# Patient Record
Sex: Female | Born: 1960 | Race: White | Hispanic: No | Marital: Married | Smoking: Never smoker
Health system: Southern US, Community
[De-identification: ages and names within clinical notes are randomized; demographics above are authoritative.]

## PROBLEM LIST (undated history)

## (undated) DIAGNOSIS — B351 Tinea unguium: Secondary | ICD-10-CM

## (undated) DIAGNOSIS — I4510 Unspecified right bundle-branch block: Secondary | ICD-10-CM

## (undated) DIAGNOSIS — E039 Hypothyroidism, unspecified: Secondary | ICD-10-CM

## (undated) DIAGNOSIS — N3281 Overactive bladder: Secondary | ICD-10-CM

## (undated) DIAGNOSIS — IMO0002 Reserved for concepts with insufficient information to code with codable children: Secondary | ICD-10-CM

## (undated) DIAGNOSIS — K219 Gastro-esophageal reflux disease without esophagitis: Secondary | ICD-10-CM

## (undated) DIAGNOSIS — A0472 Enterocolitis due to Clostridium difficile, not specified as recurrent: Secondary | ICD-10-CM

## (undated) DIAGNOSIS — M1990 Unspecified osteoarthritis, unspecified site: Secondary | ICD-10-CM

## (undated) DIAGNOSIS — R011 Cardiac murmur, unspecified: Secondary | ICD-10-CM

## (undated) DIAGNOSIS — N12 Tubulo-interstitial nephritis, not specified as acute or chronic: Secondary | ICD-10-CM

## (undated) DIAGNOSIS — T7840XA Allergy, unspecified, initial encounter: Secondary | ICD-10-CM

## (undated) DIAGNOSIS — K2270 Barrett's esophagus without dysplasia: Secondary | ICD-10-CM

## (undated) DIAGNOSIS — A419 Sepsis, unspecified organism: Secondary | ICD-10-CM

## (undated) DIAGNOSIS — A048 Other specified bacterial intestinal infections: Secondary | ICD-10-CM

## (undated) DIAGNOSIS — Z5189 Encounter for other specified aftercare: Secondary | ICD-10-CM

## (undated) DIAGNOSIS — K5150 Left sided colitis without complications: Secondary | ICD-10-CM

## (undated) DIAGNOSIS — IMO0001 Reserved for inherently not codable concepts without codable children: Secondary | ICD-10-CM

## (undated) DIAGNOSIS — D649 Anemia, unspecified: Secondary | ICD-10-CM

## (undated) DIAGNOSIS — E669 Obesity, unspecified: Secondary | ICD-10-CM

## (undated) DIAGNOSIS — N201 Calculus of ureter: Secondary | ICD-10-CM

## (undated) DIAGNOSIS — M722 Plantar fascial fibromatosis: Secondary | ICD-10-CM

## (undated) DIAGNOSIS — T8092XA Unspecified transfusion reaction, initial encounter: Secondary | ICD-10-CM

## (undated) DIAGNOSIS — Z87442 Personal history of urinary calculi: Secondary | ICD-10-CM

## (undated) DIAGNOSIS — R7881 Bacteremia: Secondary | ICD-10-CM

## (undated) HISTORY — DX: Reserved for inherently not codable concepts without codable children: IMO0001

## (undated) HISTORY — DX: Reserved for concepts with insufficient information to code with codable children: IMO0002

## (undated) HISTORY — DX: Encounter for other specified aftercare: Z51.89

## (undated) HISTORY — DX: Enterocolitis due to Clostridium difficile, not specified as recurrent: A04.72

## (undated) HISTORY — DX: Barrett's esophagus without dysplasia: K22.70

## (undated) HISTORY — DX: Sepsis, unspecified organism: A41.9

## (undated) HISTORY — DX: Hypothyroidism, unspecified: E03.9

## (undated) HISTORY — DX: Obesity, unspecified: E66.9

## (undated) HISTORY — DX: Unspecified right bundle-branch block: I45.10

## (undated) HISTORY — DX: Other specified bacterial intestinal infections: A04.8

## (undated) HISTORY — DX: Gastro-esophageal reflux disease without esophagitis: K21.9

## (undated) HISTORY — DX: Unspecified transfusion reaction, initial encounter: T80.92XA

## (undated) HISTORY — PX: UPPER GASTROINTESTINAL ENDOSCOPY: SHX188

## (undated) HISTORY — DX: Unspecified osteoarthritis, unspecified site: M19.90

## (undated) HISTORY — DX: Plantar fascial fibromatosis: M72.2

## (undated) HISTORY — DX: Anemia, unspecified: D64.9

## (undated) HISTORY — DX: Left sided colitis without complications: K51.50

## (undated) HISTORY — DX: Tinea unguium: B35.1

## (undated) HISTORY — PX: COLONOSCOPY: SHX174

## (undated) HISTORY — PX: LAPAROSCOPIC GASTRIC BANDING WITH HIATAL HERNIA REPAIR: SHX6351

## (undated) HISTORY — DX: Bacteremia: R78.81

## (undated) HISTORY — DX: Calculus of ureter: N20.1

## (undated) HISTORY — DX: Overactive bladder: N32.81

## (undated) HISTORY — DX: Cardiac murmur, unspecified: R01.1

## (undated) HISTORY — DX: Allergy, unspecified, initial encounter: T78.40XA

---
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
## 1999-02-08 ENCOUNTER — Other Ambulatory Visit: Admission: RE | Admit: 1999-02-08 | Discharge: 1999-02-08 | Payer: Self-pay | Admitting: Obstetrics & Gynecology

## 2000-03-08 ENCOUNTER — Other Ambulatory Visit: Admission: RE | Admit: 2000-03-08 | Discharge: 2000-03-08 | Payer: Self-pay | Admitting: Obstetrics & Gynecology

## 2000-11-14 HISTORY — PX: CHOLECYSTECTOMY: SHX55

## 2001-05-30 ENCOUNTER — Other Ambulatory Visit: Admission: RE | Admit: 2001-05-30 | Discharge: 2001-05-30 | Payer: Self-pay | Admitting: Obstetrics & Gynecology

## 2001-11-07 ENCOUNTER — Emergency Department (HOSPITAL_COMMUNITY): Admission: EM | Admit: 2001-11-07 | Discharge: 2001-11-08 | Payer: Self-pay | Admitting: Emergency Medicine

## 2001-11-08 ENCOUNTER — Ambulatory Visit (HOSPITAL_COMMUNITY): Admission: RE | Admit: 2001-11-08 | Discharge: 2001-11-08 | Payer: Self-pay | Admitting: Emergency Medicine

## 2001-11-08 ENCOUNTER — Encounter: Payer: Self-pay | Admitting: Emergency Medicine

## 2001-11-22 ENCOUNTER — Encounter: Payer: Self-pay | Admitting: General Surgery

## 2001-11-26 ENCOUNTER — Observation Stay (HOSPITAL_COMMUNITY): Admission: RE | Admit: 2001-11-26 | Discharge: 2001-11-27 | Payer: Self-pay | Admitting: General Surgery

## 2001-11-26 ENCOUNTER — Encounter (INDEPENDENT_AMBULATORY_CARE_PROVIDER_SITE_OTHER): Payer: Self-pay | Admitting: Specialist

## 2002-01-15 ENCOUNTER — Encounter: Payer: Self-pay | Admitting: Gastroenterology

## 2002-01-15 ENCOUNTER — Ambulatory Visit (HOSPITAL_COMMUNITY): Admission: RE | Admit: 2002-01-15 | Discharge: 2002-01-15 | Payer: Self-pay | Admitting: Gastroenterology

## 2002-08-27 ENCOUNTER — Other Ambulatory Visit: Admission: RE | Admit: 2002-08-27 | Discharge: 2002-08-27 | Payer: Self-pay | Admitting: Obstetrics & Gynecology

## 2003-03-27 ENCOUNTER — Encounter: Payer: Self-pay | Admitting: Emergency Medicine

## 2003-03-27 ENCOUNTER — Emergency Department (HOSPITAL_COMMUNITY): Admission: EM | Admit: 2003-03-27 | Discharge: 2003-03-27 | Payer: Self-pay | Admitting: Emergency Medicine

## 2003-09-05 ENCOUNTER — Other Ambulatory Visit: Admission: RE | Admit: 2003-09-05 | Discharge: 2003-09-05 | Payer: Self-pay | Admitting: Obstetrics & Gynecology

## 2004-01-15 ENCOUNTER — Ambulatory Visit (HOSPITAL_COMMUNITY): Admission: RE | Admit: 2004-01-15 | Discharge: 2004-01-15 | Payer: Self-pay | Admitting: Gastroenterology

## 2005-05-04 ENCOUNTER — Ambulatory Visit: Payer: Self-pay | Admitting: Gastroenterology

## 2006-05-10 ENCOUNTER — Ambulatory Visit: Payer: Self-pay | Admitting: Gastroenterology

## 2006-11-16 ENCOUNTER — Ambulatory Visit: Payer: Self-pay | Admitting: Gastroenterology

## 2006-11-24 ENCOUNTER — Encounter (INDEPENDENT_AMBULATORY_CARE_PROVIDER_SITE_OTHER): Payer: Self-pay | Admitting: Specialist

## 2006-11-24 ENCOUNTER — Encounter (INDEPENDENT_AMBULATORY_CARE_PROVIDER_SITE_OTHER): Payer: Self-pay | Admitting: *Deleted

## 2006-11-24 ENCOUNTER — Ambulatory Visit: Payer: Self-pay | Admitting: Gastroenterology

## 2006-12-06 ENCOUNTER — Ambulatory Visit: Payer: Self-pay | Admitting: Gastroenterology

## 2006-12-06 LAB — CONVERTED CEMR LAB
Basophils Relative: 1.1 % — ABNORMAL HIGH (ref 0.0–1.0)
Folate: 10.5 ng/mL
Monocytes Relative: 5.4 % (ref 3.0–11.0)
Platelets: 457 10*3/uL — ABNORMAL HIGH (ref 150–400)
RDW: 17.2 % — ABNORMAL HIGH (ref 11.5–14.6)
Saturation Ratios: 6.3 % — ABNORMAL LOW (ref 20.0–50.0)
Transferrin: 339.3 mg/dL (ref >=212.0)
Vitamin B-12: 273 pg/mL (ref 211–911)

## 2006-12-12 ENCOUNTER — Encounter (INDEPENDENT_AMBULATORY_CARE_PROVIDER_SITE_OTHER): Payer: Self-pay | Admitting: *Deleted

## 2006-12-12 ENCOUNTER — Ambulatory Visit (HOSPITAL_COMMUNITY): Admission: RE | Admit: 2006-12-12 | Discharge: 2006-12-12 | Payer: Self-pay | Admitting: Gastroenterology

## 2007-01-01 ENCOUNTER — Ambulatory Visit: Payer: Self-pay | Admitting: Gastroenterology

## 2007-01-01 LAB — CONVERTED CEMR LAB: OCCULT 4: NEGATIVE

## 2007-05-25 ENCOUNTER — Encounter: Admission: RE | Admit: 2007-05-25 | Discharge: 2007-05-25 | Payer: Self-pay | Admitting: Surgery

## 2007-07-24 ENCOUNTER — Encounter: Admission: RE | Admit: 2007-07-24 | Discharge: 2007-10-22 | Payer: Self-pay | Admitting: Surgery

## 2007-08-07 ENCOUNTER — Encounter: Payer: Self-pay | Admitting: Internal Medicine

## 2007-08-07 HISTORY — PX: LAPAROSCOPIC GASTRIC BANDING: SHX1100

## 2007-08-08 ENCOUNTER — Inpatient Hospital Stay (HOSPITAL_COMMUNITY): Admission: RE | Admit: 2007-08-08 | Discharge: 2007-08-10 | Payer: Self-pay | Admitting: Surgery

## 2008-11-11 ENCOUNTER — Encounter: Payer: Self-pay | Admitting: Internal Medicine

## 2008-11-11 ENCOUNTER — Encounter: Admission: RE | Admit: 2008-11-11 | Discharge: 2008-11-11 | Payer: Self-pay | Admitting: Surgery

## 2009-10-23 ENCOUNTER — Encounter (INDEPENDENT_AMBULATORY_CARE_PROVIDER_SITE_OTHER): Payer: Self-pay | Admitting: *Deleted

## 2009-11-25 ENCOUNTER — Ambulatory Visit: Payer: Self-pay | Admitting: Internal Medicine

## 2009-11-25 ENCOUNTER — Encounter (INDEPENDENT_AMBULATORY_CARE_PROVIDER_SITE_OTHER): Payer: Self-pay | Admitting: *Deleted

## 2009-11-25 DIAGNOSIS — D509 Iron deficiency anemia, unspecified: Secondary | ICD-10-CM

## 2009-11-25 DIAGNOSIS — K2270 Barrett's esophagus without dysplasia: Secondary | ICD-10-CM

## 2009-11-25 DIAGNOSIS — K219 Gastro-esophageal reflux disease without esophagitis: Secondary | ICD-10-CM

## 2009-11-27 LAB — CONVERTED CEMR LAB
Basophils Relative: 0.7 % (ref 0.0–3.0)
Eosinophils Relative: 3.6 % (ref 0.0–5.0)
HCT: 37.7 % (ref 36.0–46.0)
MCV: 89.7 fL (ref 78.0–100.0)
Monocytes Absolute: 0.4 10*3/uL (ref 0.1–1.0)
Monocytes Relative: 7.7 % (ref 3.0–12.0)
Neutrophils Relative %: 56.7 % (ref 43.0–77.0)
RBC: 4.21 M/uL (ref 3.87–5.11)
WBC: 5.7 10*3/uL (ref 4.5–10.5)

## 2009-12-07 ENCOUNTER — Ambulatory Visit: Payer: Self-pay | Admitting: Internal Medicine

## 2009-12-15 ENCOUNTER — Encounter: Payer: Self-pay | Admitting: Internal Medicine

## 2010-02-19 ENCOUNTER — Encounter: Payer: Self-pay | Admitting: Internal Medicine

## 2010-02-25 ENCOUNTER — Emergency Department (HOSPITAL_COMMUNITY): Admission: EM | Admit: 2010-02-25 | Discharge: 2010-02-25 | Payer: Self-pay | Admitting: Emergency Medicine

## 2010-02-26 ENCOUNTER — Encounter: Admission: RE | Admit: 2010-02-26 | Discharge: 2010-02-26 | Payer: Self-pay | Admitting: Surgery

## 2010-12-16 NOTE — Procedures (Signed)
Summary: Esophageal Manometry/[facility]  Esophageal Manometry/[facility]   Imported By: Phillis Knack 11/27/2009 13:08:49  _____________________________________________________________________  External Attachment:    Type:   Image     Comment:   External Document

## 2010-12-16 NOTE — Miscellaneous (Signed)
Summary: omeprazole prescription  Clinical Lists Changes  Medications: Added new medication of OMEPRAZOLE 40 MG  CPDR (OMEPRAZOLE) 1 each day 30 minutes before meal - Signed Rx of OMEPRAZOLE 40 MG  CPDR (OMEPRAZOLE) 1 each day 30 minutes before meal;  #30 x 3;  Signed;  Entered by: Joylene John RN;  Authorized by: Gatha Mayer MD, [facility];  Method used: Electronically to [id number] [address], [place], [place]  [address], Ph: [phone] or [phone], Fax: [phone]    Prescriptions: OMEPRAZOLE 40 MG  CPDR (OMEPRAZOLE) 1 each day 30 minutes before meal  #30 x 3   Entered by:   Joylene John RN   Authorized by:   Gatha Mayer MD, [facility]   Signed by:   Joylene John RN on 12/07/2009   Method used:   Electronically to        [id number] (retail)       [address]       [place], [place]  [address]       Ph: [phone] or [phone]       Fax: [phone]   RxID:   [id number]

## 2010-12-16 NOTE — Letter (Signed)
Summary: Patient Notice-[facility]  Section, [place] [address]   Phone: [phone]  Fax: [phone]        December 15, 2009 MRN: [id number]    [place], [place]  [address]    Dear Ms. Jannifer Franklin,  I am pleased to inform you that the biopsies taken during your recent endoscopic examination did not show any evidence of cancer upon pathologic examination.  However, your biopsies indicate you still have a condition known as Barrett's esophagus. While not cancer, it is pre-cancerous (can progress to cancer) and needs to be monitored with repeat endoscopic examination and biopsies.  Fortunately, it is quite rare that this develops into cancer, but careful monitoring of the condition along with taking your medication as prescribed is important in reducing the risk of developing cancer.  It is my recommendation that you have a repeat upper gastrointestinal endoscopic examination in 3 years.  Continue with treatment plan as outlined the day of your exam.  By the time you receive this letter you should have heard from [place] about scheduling an appointment to see Dr. Hassell Done in follow-up. I have not planned a follow-up visit with me yet but anticipate seeing you in the office at some point in the next several months. I will decide that after you see Dr. Hassell Done.  Please call us if you have or develop heartburn, reflux symptoms, any swallowing problems, or if you have questions about your condition that have not been fully answered at this time.  Sincerely,  Gatha Mayer MD, [facility]  This letter has been electronically signed by your physician.  Appended Document: Patient Notice-Barrett's [facility]opghagus letter mailed 2.4.11

## 2010-12-16 NOTE — Op Note (Signed)
Summary: lap band and hiatal hernia repair  NAME:  Joyce Kim, Joyce Kim                ACCOUNT NO.:  [id number]      MEDICAL RECORD NO.:  [id number]          PATIENT TYPE:  AMB      LOCATION:  DAY                          FACILITY:  [facility]      PHYSICIAN:  Isabel Caprice. Hassell Done, MD  DATE OF BIRTH:  12/17/1960      DATE OF PROCEDURE:  08/07/2007   DATE OF DISCHARGE:                                  OPERATIVE REPORT      PREOPERATIVE DIAGNOSES:   1. Morbid obesity, body mass index 46.   2. Large type 3 mixed hiatal hernia.      PROCEDURES:   1. Laparoscopic repair of hiatal hernia.   2. Allergan lap band placement.      SURGEON:  Isabel Caprice. Hassell Done, MD.      ASSISTANTMarland Kitchen T. Hoxworth, M.D.      ANESTHESIA:  General endotracheal.      DESCRIPTION OF PROCEDURE:  The patient is a 4-year lady who was taken   to room #1 and given general anesthesia.  Preoperatively, she refused to   consider gastric bypass but elected to go with the gastric banding   because her morbid obesity.  In addition she has a very large hiatal   hernia.      Standard trocars were placed after initially getting in through the left   upper quadrant with a 10 Optiview 0 degree scope.  The liver was   retracted with the [facility] type retractor.  The Babcocks were used on   the stomach and it was brought down and the Harmonic scalpel was used to   tease the stomach out of the chest and to cut the sac.  I did this from   the left to the right side freeing this in its entirety.  I eventually   put a Penrose around the back of the esophagogastric junction getting   the esophagus to length.  There was no question with the landmarks that   we got good esophagus to length in the abdomen and I made a decision not   to endoscope the patient.  The crura were well defined and I went ahead   and repaired those with four pledgeted sutures posteriorly and got it   nice and snug.  I then took a piece of MTF mesh using a MTF 4 cm  x 7 cm   cutting it and then putting it in onto the diaphragm and suturing in   place with four sutures of 2-0 Surgitek using a free needle and then   tacking it with the tie knot.  Beneath this was squirted Tisseel to help   seal it on the diaphragm.         Repair looked good and I then went down to the base of the right crus   and found an uninterrupted spot, passed a band passer without difficulty   around the stomach and then plicated an Allergan APL large band with   three sutures, again placed individually  and tied with tie knots.  It   was then brought out through the 15 trocar in the right lower side and   then standard pocket was made with four sutures of 2-0 Prolene and the   excess fluid was relieved from the   band and then it was connected to the port which was then sutured to the   fascia with four sutures.  Wounds were all irrigated and closed 4-0   Vicryl and with Dermabond.  Also some of them had some staples place as   well.  The patient seemed to tolerate the procedure well and was taken   to the recovery room in satisfactory condition.               Isabel Caprice Hassell Done, MD   Electronically Signed            MBM/MEDQ  D:  08/07/2007  T:  08/08/2007  Job:  [id number]

## 2010-12-16 NOTE — Letter (Signed)
Summary: EGD Instructions  [facility]  [place], [place] [address]   Phone: [phone]  Fax: [phone]       ELYCIA WOODSIDE    1961/09/29    MRN: [id number]       Procedure Day /Date: Monday, 12/07/09     Arrival Time:  1:00pm     Procedure Time: 2:00 pm     Location of Procedure:                    _[facility] (4th Floor)  PREPARATION FOR ENDOSCOPY   On Monday 12/07/09,  THE DAY OF THE PROCEDURE:  1.   No solid foods, milk or milk products are allowed after midnight the night before your procedure.  2.   Do not drink anything colored red or purple.  Avoid juices with pulp.  No orange juice.  3.  You may drink clear liquids until 12:00pm, which is 2 hours before your procedure.                                                                                                CLEAR LIQUIDS INCLUDE: Water Jello Ice Popsicles Tea (sugar ok, no milk/cream) Powdered fruit flavored drinks Coffee (sugar ok, no milk/cream) Gatorade Juice: apple, white grape, white cranberry  Lemonade Clear bullion, consomm, broth Carbonated beverages (any kind) Strained chicken noodle soup Hard Candy   MEDICATION INSTRUCTIONS  Unless otherwise instructed, you should take regular prescription medications with a small sip of water as early as possible the morning of your procedure.  Additional medication instructions: None                OTHER INSTRUCTIONS  You will need a responsible adult at least 49 years of age to accompany you and drive you home.   This person must remain in the waiting room during your procedure.  Wear loose fitting clothing that is easily removed.  Leave jewelry and other valuables at home.  However, you may wish to bring a book to read or an iPod/MP3 player to listen to music as you wait for your procedure to start.  Remove all body piercing jewelry and leave at home.  Total time from sign-in until discharge  is approximately 2-3 hours.  You should go home directly after your procedure and rest.  You can resume normal activities the day after your procedure.  The day of your procedure you should not:   Drive   Make legal decisions   Operate machinery   Drink alcohol   Return to work  You will receive specific instructions about eating, activities and medications before you leave.    The above instructions have been reviewed and explained to me by   _______________________    I fully understand and can verbalize these instructions _____________________________ Date _________

## 2010-12-16 NOTE — Assessment & Plan Note (Signed)
Summary: dysphagia from time to time. Hx Barretts   History of Present Illness Visit Type: Initial Visit Primary GI MD: Silvano Rusk MD [facility] Primary Provider: Dory Horn, MD Chief Complaint: Patient c/o several months of feeling a "lump in her throat." She also c/o gerd, nausea and vomiting which she attributes to her lapband surgery. She denies any abdominal pain. History of Present Illness:   50 year old white woman with problems as described above. This has been increasing over time, since the lap band surgery (2008), but especially in the last year.. That surgery has been successful with 100+ pound weight loss. There is some difficulty swallowing, and there is rapid regurgitation but there may be some impacted dysphagia as well. She has not gotten on proton pump inhibitors or antacids I am aware of. I think she has had her lack and tightened 3 times since the initial surgery. She has not discussed this with her surgeon.   GI Review of Systems    Reports acid reflux, belching, heartburn, nausea, and  vomiting.      Denies abdominal pain, bloating, chest pain, dysphagia with liquids, dysphagia with solids, loss of appetite, vomiting blood, weight loss, and  weight gain.        Denies anal fissure, black tarry stools, change in bowel habit, constipation, diarrhea, diverticulosis, fecal incontinence, heme positive stool, hemorrhoids, irritable bowel syndrome, jaundice, light color stool, liver problems, rectal bleeding, and  rectal pain.    Current Medications (verified): 1)  Multivitamins  Tabs (Multiple Vitamin) .... Take 1 Tablet By Mouth Once A Day 2)  Vitamin C 500 Mg Tabs (Ascorbic Acid) .... Take 1 Tablet By Mouth Once A Day 3)  Vitamin B12 (Unknown Dosage) .... Take 1 Tablet By Mouth Once A Day 4)  Vitamin B6 (Unknown Dosage) .... Take 1 Tablet By Mouth Once A Day  Allergies (verified): 1)  ! Sulfa 2)  ! * Ivp Dye 3)  ! Doxycycline  Past History:  Past Medical  History: Barrett's Esophagus dx 2008 Anemia GERD, large hiatal hernia s/p repair Obesity  Past Surgical History: Cholecystectomy Bariatric Surgery (lapband) 2008 Martin Hiatal Herniorapphy Repair of Ureters   Family History: Family History of Breast Cancer: Great Aunts, Aunts No FH of Colon Cancer: Family History of Pancreatic Cancer: Paternal Grandfather, Maternal Grandfather Family History of Colon Polyps: Mother Family History of Diabetes: Aunts, Cousins Family History of Heart Disease: Aunts  Social History: Occupation: Student Patient has never smoked.  Alcohol Use - yes-rare Daily Caffeine Use-1 cup daily Illicit Drug Use - no Patient does not get regular exercise.  Smoking Status:  never Drug Use:  no Does Patient Exercise:  no  Review of Systems       The patient complains of allergy/sinus.         All other ROS negative except as per HPI.   Vital Signs:  Patient profile:   50 year old female Height:      67 inches Weight:      221.13 pounds BMI:     34.76 BSA:     2.11 Pulse rate:   80 / minute Pulse rhythm:   regular BP sitting:   112 / 68  (left arm)  Vitals Entered By: Awilda Bill CMA Deborra Medina) (November 25, 2009 10:33 AM)  Physical Exam  General:  obese.   Eyes:  PERRLA, no icterus. Mouth:  No deformity or lesions, dentition normal. Neck:  Supple; no masses or thyromegaly. Lungs:  Clear throughout to auscultation. Heart:  Regular rate and rhythm; no murmurs, rubs,  or bruits. Abdomen:  obese, soft and non-tender s/p lap band surgery, scars no HSM BS+ Neurologic:  Alert and  oriented x4;  Cervical Nodes:  No significant cervical adenopathy. Psych:  Alert and cooperative. Normal mood and affect.   Impression & Recommendations:  Problem # 1:  DYSPHAGIA (ICD-787.29) Assessment New ? due to lap band - 11/11/08 UGI shows tight lap band with little passage of liquid this was found after visit await EGD Orders: EGD (EGD)  Problem # 2:   BARRETT'S ESOPHAGUS (ICD-530.85) Assessment: Unchanged needs to be reassesed - she is overdue for first 1 yr follow-up with large hiatal hernia sometimes can get cardia and not distal esophageal biopsies so important to see what is present after hiatal hernia repair Orders: EGD (EGD)  Problem # 3:  GERD (ICD-530.81) Assessment: Deteriorated await EGD ? how much due to lap band  Problem # 4:  ANEMIA-IRON DEFICIENCY (ICD-280.9) Assessment: Unchanged has not had a CBC since 2008 or 2009 though may have had Hct at GYN Iron deficiency previously thought due to hiatal hernia Orders: TLB-CBC Platelet - w/Differential (85025-CBCD) TLB-Ferritin (82728-FER)  Patient Instructions: 1)  Please go to the basement to have your lab tests drawn today.  2)  We will see you at your procedure on 12/07/09. 3)  [facility] Patient Information Guide given to patient.  4)  Upper Endoscopy brochure given.  5)  Copy sent to : Kaylyn Lim, MD 6)  The medication list was reviewed and reconciled.  All changed / newly prescribed medications were explained.  A complete medication list was provided to the patient / caregiver.  Appended Document: dysphagia from time to time. Hx Barretts HPI addedum: her bed is elevated 2" and uses pillows to elevate also problems are not clearly related to eating late and lying down as has happened either way recently has had painful swallowing, [facility] will reflux and be awakened at night

## 2010-12-16 NOTE — Procedures (Signed)
Summary: Upper Endoscopy  Patient: [name] Note: All result statuses are Final unless otherwise noted.  Tests: (1) Upper Endoscopy (EGD)   EGD Upper Endoscopy       [facility] [employer] [facility] [employer]     [place], [place]  [address]           ENDOSCOPY PROCEDURE REPORT           PATIENT:  Joyce Kim, Joyce Kim  MR#:  [id number]     BIRTHDATE:  26-Oct-1961, 48 yrs. old  GENDER:  female           ENDOSCOPIST:  Gatha Mayer, MD, [facility]           PROCEDURE DATE:  12/07/2009     PROCEDURE:  EGD with biopsy     ASA CLASS:  Class I     INDICATIONS:  follow-up of Barrett's Esophagus, dysphagia more of     a globus with regurgitation problems though some ? of impact     dysphagia. S/p fundoplication and laparoscopic band procedure for     obesity           MEDICATIONS:   Fentanyl 50 mcg IV, Versed 9 mg IV     TOPICAL ANESTHETIC:  Exactacain Spray           DESCRIPTION OF PROCEDURE:   After the risks benefits and     alternatives of the procedure were thoroughly explained, informed     consent was obtained.  The [facility] GIF-H180 [id number] endoscope was     introduced through the mouth and advanced to the second portion of     the duodenum, without limitations.  The instrument was slowly     withdrawn as the mucosa was fully examined.     <<PROCEDUREIMAGES>>           There were columnar mucosal changes consistent with Barrett's     esophagus identified. in the distal esophagus. Irregular columns     of salmon-colored mucosa from 27-30 cm, ? Barrett's vs.     esophagitis or both. There is one larger area 1-2 cm seen at     z-line area (30 cm) that rolls back and forth with motility that     could be Barrett's also. Multiple biopsies were obtained and sent     to pathology.  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 35 cm from the     incisors, ? lap band constriction vs. recurrent hiatal hernia.     Otherwise the examination was normal.    Retroflexed views revealed     Retroflexion  exam demonstrated findings as previously described.     The scope was then withdrawn from the patient and the procedure     completed.           COMPLICATIONS:  None           ENDOSCOPIC IMPRESSION:     1) Columnar mucosa, ? 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     4) Otherwise normal examination           RECOMMENDATIONS:     Start omeprazole 40 mg daily every AM 30 mins before breakfast     #30 3 refills           REPEAT  EXAM:  In for EGD, pending biopsy results.           Gatha Mayer, MD, [facility]           CC:  Johnathan Hausen, MD     The Patient           n.     Lorrin Mais:   Gatha Mayer at 12/07/2009 02:49 PM           Juanito Doom, [id number]  Note: An exclamation mark (!) indicates a result that was not dispersed into the flowsheet. Document Creation Date: 12/07/2009 2:49 PM _______________________________________________________________________  (1) Order result status: Final Collection or observation date-time: 12/07/2009 14:28 Requested date-time:  Receipt date-time:  Reported date-time:  Referring Physician:   Ordering Physician: Silvano Rusk [phone]) Specimen Source:  Source: Tawanna Cooler Order Number: [id number] Lab site:   Appended Document: Upper Endoscopy will make her an appointment to see Dr. Hassell Done  Appended Document: Upper Endoscopy Dr Hassell Done 12-25-09 2:40  Appended Document: Upper Endoscopy     Procedures Next Due Date:    EGD: 12/2012

## 2010-12-16 NOTE — Procedures (Signed)
Summary: Colonosocpy: Dr. Velora Heckler   Colonoscopy  Procedure date:  11/24/2006  Findings:      Results: Normal. Location:  [facility].    Procedures Next Due Date:    Colonoscopy: 11/2011  Patient Name: Joyce Kim, Joyce Kim MRN: [id number] Procedure Procedures: Colonoscopy CPT: 27253.    with multiple biopsies. CPT: [phone].  Personnel: Endoscopist: Clarene Reamer, MD.  Exam Location: Exam performed in Outpatient Clinic. Outpatient  Patient Consent: Procedure, Alternatives, Risks and Benefits discussed, consent obtained, from patient. Consent was obtained by the RN.  Indications  Evaluation of: Anemia with low iron saturation.  Symptoms: Diarrhea Patient is having increased frequency of stools.  History  Current Medications: Patient is not currently taking Coumadin.  Pre-Exam Physical: Performed Dec 02, 2002. Entire physical exam was normal. Cardio- pulmonary exam, Rectal exam, HEENT exam , Abdominal exam, Mental status exam WNL.  Comments: Pt. history reviewed/updated, physical exam performed prior to initiation of sedation? Exam Exam: Extent of exam reached: Ileum, extent intended: Ileum.  The cecum was identified by appendiceal orifice and IC valve. Colon retroflexion performed. Images taken. ASA Classification: II. Tolerance: good.  Monitoring: Pulse and BP monitoring, Oximetry used. Supplemental O2 given.  Colon Prep Prep results: good.  Sedation Meds: Patient assessed and found to be appropriate for moderate (conscious) sedation.  Findings - NOT SEEN ON EXAM: Ileum to Rectum. Polyps, AVM's, Colitis, Tumors, Melanosis, Crohn's, Diverticulosis, Hemorrhoids, Comments: not sure why anemic and no endoscopic evidence for diarrhea   multiple bx,s to r/o microscopic colitis or colagenous.   Assessment Normal examination.  Events  Unplanned Interventions: No intervention was required.  Unplanned Events: There were no  complications. Plans Medication Plan: Await pathology. Continue current medications.  Patient Education: Patient given standard instructions for: a normal exam. Patient instructed to get routine colonoscopy every 5 years.  Disposition: After procedure patient sent to recovery. After recovery patient sent home.  Scheduling/Referral: Call office for appointment, to Clarene Reamer, MD, around Dec 06, 2006.   This report was created from the original endoscopy report, which was reviewed and signed by the above listed endoscopist.

## 2010-12-16 NOTE — Letter (Signed)
Summary: [facility]   Imported By: Phillis Knack 03/09/2010 13:11:50  _____________________________________________________________________  External Attachment:    Type:   Image     Comment:   External Document

## 2010-12-16 NOTE — Letter (Signed)
Summary: *Referral Letter  [facility]  [place], [place] [address]   Phone: [phone]  Fax: [phone]    12/15/2009  Johnathan Hausen, MD, [place], [place] [address]  Dear Catalina Antigua:   Thank you in advance for agreeing to see my patient:  Joyce Kim [address] [place], [place]  [address]  Phone: [phone]  Reason for Referral: GERD and Barrett's with symptoms of gastric dysfunction after lap band and hiatal hernia repair. ? if lap band needs adjustment    Current Medications: 1)  MULTIVITAMINS  TABS (MULTIPLE VITAMIN) Take 1 tablet by mouth once a day 2)  VITAMIN C 500 MG TABS (ASCORBIC ACID) Take 1 tablet by mouth once a day 3)  * VITAMIN B12 (UNKNOWN DOSAGE) Take 1 tablet by mouth once a day 4)  * VITAMIN B6 (UNKNOWN DOSAGE) Take 1 tablet by mouth once a day 5)  IRON 325 (65 FE) MG TABS (FERROUS SULFATE) 1 by mouth qd 6)  OMEPRAZOLE 40 MG  CPDR (OMEPRAZOLE) 1 each day 30 minutes before meal   Past Medical History: 1)  Barrett's Esophagus dx 2008 2)  Anemia 3)  GERD, large hiatal hernia s/p repair 4)  Obesity    Pertinent Labs: CBC is normal. ferritin low normal   Thank you again for agreeing to see our patient; please contact us if you have any further questions or need additional information.  Sincerely,  Gatha Mayer MD, [facility]  Appended Document: *Referral Letter Patient notified of appointment with Dr Hassell Done for 12-25-09 2:40. Patient also notified of path results   Clinical Lists Changes  Orders: Added new Test order of [facility] ([facility]) - Signed

## 2010-12-16 NOTE — Procedures (Signed)
Summary: EGD: Dr. Velora Heckler: [facility]   EGD  Procedure date:  11/24/2006  Findings:      Location: [place]   Patient Name: Joyce, Kim MRN: [id number] Procedure Procedures: Panendoscopy (EGD) CPT: 02111.    with biopsy(s)/brushing(s). CPT: U5434024.  Personnel: Endoscopist: Clarene Reamer, MD.  Exam Location: Exam performed in Outpatient Clinic. Outpatient  Patient Consent: Procedure, Alternatives, Risks and Benefits discussed, consent obtained, from patient. Consent was obtained by the RN.  Indications  Evaluation of: Anemia,  with low iron saturation.  Symptoms: Dyspepsia, Reflux symptoms  History  Current Medications: Patient is not currently taking Coumadin.  Pre-Exam Physical: Performed Dec 02, 2002  Entire physical exam was normal. Cardio- pulmonary exam, HEENT exam, Abdominal exam, Mental status exam WNL.  Comments: Pt. history reviewed/updated, physical exam performed prior to initiation of sedation? Exam Exam Info: Maximum depth of insertion Duodenum, intended Duodenum. Patient position: on left side. Vocal cords visualized. Gastric retroflexion performed. Images taken. ASA Classification: II. Tolerance: good.  Sedation Meds: Patient assessed and found to be appropriate for moderate (conscious) sedation. Fentanyl given IV. Versed given IV. Cetacaine Spray given aerosolized.  Monitoring: BP and pulse monitoring done. Oximetry used. Supplemental O2 given  Findings - HIATAL HERNIA: 10 cms. in length. r/o barretts. Biopsy/Hiatal Hernia taken.  ICD9: Hernia, Hiatal: 553.3. - MUCOSAL ABNORMALITY: Duodenal Bulb to Jejunum. Erythematous mucosa. Granular mucosa. Edema present. Comment: mild chronic duodenitis.  - MUCOSAL ABNORMALITY: Body to Antrum. Erythematous mucosa. Granular mucosa. RUT done, results pending. Comment: mild gastritis.   Assessment Abnormal examination, see findings above.  Diagnoses: 530.11: Reflux.  553.3: Hernia,  Hiatal.   Events  Unplanned Intervention: No unplanned interventions were required.  Unplanned Events: There were no complications. Plans Medication(s): Await pathology. Continue current medications. PPI: Lansoprazole/Prevacid 30 mg   Patient Education: Patient given standard instructions for: Hiatal Hernia. Reflux. Mucosal Abnormality.  Comments: Joyce Kim really needs to consider a fundoplication   Disposition: After procedure patient sent to recovery. After recovery patient sent home.   This report was created from the original endoscopy report, which was reviewed and signed by the above listed endoscopist.

## 2011-03-29 NOTE — Op Note (Signed)
Joyce Kim, Joyce Kim                ACCOUNT NO.:  [id number]   MEDICAL RECORD NO.:  [id number]          PATIENT TYPE:  AMB   LOCATION:  DAY                          FACILITY:  [facility]   PHYSICIAN:  Isabel Caprice. Hassell Done, MD  DATE OF BIRTH:  09-12-1961   DATE OF PROCEDURE:  08/07/2007  DATE OF DISCHARGE:                               OPERATIVE REPORT   PREOPERATIVE DIAGNOSES:  1. Morbid obesity, body mass index 46.  2. Large type 3 mixed hiatal hernia.   PROCEDURES:  1. Laparoscopic repair of hiatal hernia.  2. Allergan lap band placement.   SURGEON:  Isabel Caprice. Hassell Done, MD.   ASSISTANTMarland Kitchen T. Hoxworth, M.D.   ANESTHESIA:  General endotracheal.   DESCRIPTION OF PROCEDURE:  The patient is a 82-year lady who was taken  to room #1 and given general anesthesia.  Preoperatively, she refused to  consider gastric bypass but elected to go with the gastric banding  because her morbid obesity.  In addition she has a very large hiatal  hernia.   Standard trocars were placed after initially getting in through the left  upper quadrant with a 10 Optiview 0 degree scope.  The liver was  retracted with the [facility] type retractor.  The Babcocks were used on  the stomach and it was brought down and the Harmonic scalpel was used to  tease the stomach out of the chest and to cut the sac.  I did this from  the left to the right side freeing this in its entirety.  I eventually  put a Penrose around the back of the esophagogastric junction getting  the esophagus to length.  There was no question with the landmarks that  we got good esophagus to length in the abdomen and I made a decision not  to endoscope the patient.  The crura were well defined and I went ahead  and repaired those with four pledgeted sutures posteriorly and got it  nice and snug.  I then took a piece of MTF mesh using a MTF 4 cm x 7 cm  cutting it and then putting it in onto the diaphragm and suturing in  place with four  sutures of 2-0 Surgitek using a free needle and then  tacking it with the tie knot.  Beneath this was squirted Tisseel to help  seal it on the diaphragm.    Repair looked good and I then went down to the base of the right crus  and found an uninterrupted spot, passed a band passer without difficulty  around the stomach and then plicated an Allergan APL large band with  three sutures, again placed individually and tied with tie knots.  It  was then brought out through the 15 trocar in the right lower side and  then standard pocket was made with four sutures of 2-0 Prolene and the  excess fluid was relieved from the  band and then it was connected to the port which was then sutured to the  fascia with four sutures.  Wounds were all irrigated and closed 4-0  Vicryl and with  Dermabond.  Also some of them had some staples place as  well.  The patient seemed to tolerate the procedure well and was taken  to the recovery room in satisfactory condition.      Isabel Caprice Hassell Done, MD  Electronically Signed     MBM/MEDQ  D:  08/07/2007  T:  08/08/2007  Job:  [id number]

## 2011-03-29 NOTE — Discharge Summary (Signed)
Joyce Kim, Joyce Kim                ACCOUNT NO.:  [id number]   MEDICAL RECORD NO.:  [id number]          PATIENT TYPE:  INP   LOCATION:  [place]                         FACILITY:  [facility]   PHYSICIAN:  Isabel Caprice. Hassell Done, MD  DATE OF BIRTH:  June 08, 1961   DATE OF ADMISSION:  08/07/2007  DATE OF DISCHARGE:                               DISCHARGE SUMMARY   PREOPERATIVE DIAGNOSES:  Morbid obesity, BMI 46, and a giant hiatal  hernia with a greater than half her stomach up in her chest.   PROCEDURE:  August 07, 2007 laparoscopic takedown and repair of large  type 3 mixed hiatal hernia with placement of APL lap band (Allergan)   COURSE IN THE HOSPITAL:  Joyce Kim is a 50 year old white female with  the above mentioned diagnoses.  She was an a.m. admission on August 07, 2007, admitted and had the above-mentioned operation.  On 09/24 she  went down for a swallow which showed her band to be in good position and  her stomach now to be below her diaphragm.  The diaphragmatic hernia was  repaired using human tissue for buttressing closure of the hiatus.  She  was seen by Ms. Aida Raider, the bariatric nurse coordinator, who  advised her on diet and she was not ready for discharge on postop day #2  as she barely taking enough liquids, but not enough to sustain her  without her IV.  This improved and by postop day #3 she was ready for  discharge.  She will be given Roxicet elixir to take if needed for pain  along with some Phenergan suppositories for nausea if that should occur.  Condition is good.  Staples were removed and she still does have  Dermabond on her wounds.  She will be followed up in the office in 1-2  weeks and arrangements made for return to work to her work at [employer].      Isabel Caprice Hassell Done, MD  Electronically Signed     MBM/MEDQ  D:  08/10/2007  T:  08/10/2007  Job:  [id number]   cc:   [facility]

## 2011-04-01 NOTE — Op Note (Signed)
[facility]  Patient:    Joyce Kim, Joyce Kim [facility] Visit Number: [id number] MRN: [id number]          Service Type: SUR Location: 4W [id number] [id number] Attending Physician:  Luella Cook Dictated by:   Sammuel Hines. Daiva Nakayama, M.D. Proc. Date: 11/26/01 Admit Date:  11/26/2001                             Operative Report  PREOPERATIVE DIAGNOSIS:  Cholecystitis with cholelithiasis.  POSTOPERATIVE DIAGNOSIS:  Cholecystitis with cholelithiasis.  PROCEDURE:  Laparoscopic cholecystectomy.  SURGEON:  Sammuel Hines. Daiva Nakayama, M.D.  ASSISTANT:  Abbott Pao. March Rummage, M.D.  ANESTHESIA:  General endotracheal.  DESCRIPTION OF PROCEDURE:  After informed consent was obtained, the patient was brought to the operating room and placed in the supine position on the operating table. After adequate induction of general anesthesia, the patients abdomen was prepped with Betadine and draped in the usual sterile manner. The area above the umbilicus was infiltrated with 0.25% Marcaine and a small transverse incision was made with a #15 blade knife. This incision was carried down through the skin and subcutaneous tissue with blunt dissection with the Kelly clamp and Army-Nav[employer] retractors until the linea alba was identified. The linea alba was incised with a #15 blade knife and each side was grasped with Kocher clamps and elevated anteriorly. The preperitoneal space was prepped bluntly with the hemostat until the peritoneum was opened and access was gained to the abdominal cavity. A finger was inserted into this opening to make sure there were no apparent adhesions to the anterior abdominal wall. A 0 Vicryl pursestring stitch was then placed in the fascia surrounding this opening. Hasson cannula was placed through this opening into the abdominal cavity and anchored in place with the previously placed Vicryl pursestring stitch. The abdomen was then insufflated with carbon dioxide without difficulty and the  laparoscope was placed through the Hasson cannula. The dome of the gallbladder and liver edge were readily identifiable and the patient was placed in the head up position. A small transverse upper midline incision was made with the #15 blade knife after infiltrating this area with   0.25% Marcaine and the 10 mm port was placed bluntly through this incision to the abdominal cavity under direct vision. Sites were then chosen laterally in the right side of the abdomen for placement of 5 mm ports and each of these areas were infiltrated with 0.25% Marcaine. Small stab incisions were made with the #15 blade knife and 5 mm ports were placed bluntly through these incisions into the abdominal cavity under direct vision. A blunt grasper was then placed through the lateral most 5 mm port and used to grasp the dome of the gallbladder. Several adhesions along the body of the gallbladder were taken down both bluntly with a dissector as well as with the Bovie electrocautery. Care was taken to stay far away from the stomach and duodenum. Once this was accomplished, the gallbladder was able to be mobilized upward and another blunt grasper was placed through the other 5 mm port and used to retract on the body and neck of the gallbladder. The peritoneal reflection at the gallbladder neck area was opened with the Bovie electrocautery and blunt dissection was then carried out in this area until the gallbladder neck-cystic duct junction was readily identified and dissected circumferentially. Care was taken to keep the common duct medial to this dissection. Once this was accomplished,  three clips were placed proximally, one distally on the cystic duct, and the duct was divided between the two. Posterior to this, the cystic artery was identified and again carefully dissected in a circumferential manner bluntly. Once this was accomplished, two clips were placed proximally and one distally on the artery and the  artery was divided between the two. Next, a laparoscopic hook cautery was used to separate the gallbladder from the liver bed. Prior to completely detaching the gallbladder from the liver bed, the liver bed was inspected and several small bleeding points were coagulated with the Bovie electrocautery. The gallbladder was then detached the rest of the way from the liver bed with the hook electrocautery without difficulty. The laparoscope was then placed in the upper midline port and a gallbladder grasper was placed through the Hasson cannula and grasped at the neck of the gallbladder. The gallbladder was then removed through the supraumbilical port with the Hasson cannula without difficulty, and the supraumbilical port was closed with the previously placed Vicryl pursestring stitch under direct vision. The abdomen was then irrigated with copious amounts of saline to the effluent was clear. The rest of the ports were then removed under direct vision and were found to be hemostatic. The skin incisions were all closed with interrupted 4-0 Monocryl subcuticular stitches and benzoin and Steri-Strips were applied. The patient tolerated the procedure well. At the end of the case, all needle, sponge, and instrument counts were correct. The patient was awakened and taken to the recovery room in stable condition. Dictated by:   Sammuel Hines. Daiva Nakayama, M.D. Attending Physician:  Luella Cook DD:  11/26/01 TD:  11/26/01 Job: [id number]

## 2011-04-01 NOTE — Procedures (Signed)
[place]  Patient:    Joyce Kim, Joyce Kim Visit Number: [id number] MRN: [id number]          Service Type: END Location: ENDO Attending Physician:  Benito Mccreedy Dictated by:   Loralee Pacas. Sharlett Iles, M.D. [facility] Proc. Date: 01/15/02 Admit Date:  01/15/2002 Discharge Date: 01/15/2002   CC:         Clarene Reamer, M.D. [facility]   Procedure Report  PROCEDURE:  Esophageal manometry.  FINDINGS:  Esophageal manometry was completed on January 15, 2002.  The results are as follows:  1. Upper esophageal sphincter:  There appears to be normal coordination    between pharyngeal contraction and cricopharyngeal relaxation. 2. Lower esophageal sphincter:  Mean pressure appears to be normal at 20 mmHg    with normal relaxation and swallowing. 3. Motility pattern:  There are normally-propagated peristaltic waves of    normal amplitude and duration throughout the length of the esophagus with    both wet and dry swallows.  Mean distal esophageal peristaltic amplitude is    approximately 75 mmHg.  ASSESSMENT:  This is a normal esophageal manometry without evidence of esophageal motility disorder. Dictated by:   Loralee Pacas. Sharlett Iles, M.D. [place] Attending Physician:  Benito Mccreedy DD:  01/21/02 TD:  01/22/02 Job: [id number]

## 2011-04-01 NOTE — Assessment & Plan Note (Signed)
[place] OFFICE NOTE   NAME:Joyce Kim, Joyce Kim                       MRN:          [id number]  DATE:11/16/2006                            DOB:          05/01/1961    Chevy comes in on January 3.  Has been anemic.  Had some positive  stools.  Says she has been tired a lot.  Not seen any blood in her  stool, but has noticed a change in her bowel habits, such as loose  stools, increased bowel activity with 3 to 4 bowel movements a day.  This has been going on for about 3 to 4 months.  Denies any pain, weight  loss, any fevers or chills.  She does have occasional heartburn.  She  has a known significant hiatal hernia.  Her last upper endoscopy was 10  years ago, but did reveal large hiatal hernia, which is known about for  years, and has been treated with proton pump inhibitor.  She is  presently taking Nexium 40 mg daily.  When she was seen by her primary  care doctor, she was found to have a hemoglobin of 10, low iron, and  positive stools.  She was, thus, referred for our assessment and  consultation.   PHYSICAL EXAMINATION:  Weight 288, blood pressure 116/66, pulse 76 and  regular.  Physical exam is unremarkable.   IMPRESSION:  History of anemia and positive stools with some mild change  in bowel habits.   RECOMMENDATIONS:  Schedule her for an upper endoscopy and a colonoscopic  examination.  In the meantime, I think she should be on some iron pills  and continue her Nexium.  I gave samples of this.     Clarene Reamer, MD  Electronically Signed    SML/MedQ  DD: 11/16/2006  DT: 11/16/2006  Job #: [id number]

## 2011-04-01 NOTE — Letter (Signed)
December 06, 2006    Isabel Caprice. Hassell Done, [place] [address], [place], [place] [address]   RE:  SOPHIA, SPERRY  MRN:  [id number]  /  DOB:  08-26-1961   Dear Catalina Antigua,   I want you to see this very nice patient of mine, Joyce Kim, who has  a very large hiatal hernia that I think probably needs surgical  correction.  She is anemic, and I do not know if the anemia is coming  from this or not.  I doubt it, and her colon was basically normal as  well.  I am continuing to work her up for this.  I am getting a barium  swallow.  She might need a capsule endoscopy if her stools are positive  and her iron is continuing to be low just for completeness, and I did  tell her I think she needs to lose weight prior to her surgical  procedure.  She would like to have it, for whatever reasons, prior to my  retirement which is going to be the 1st of June, if possible.  I would  like you to see her.  I have encouraged her to lose the weight because I  know it will be helpful to her in many ways from the surgical standpoint  and postsurgical standpoint.  Thanks for seeing this very nice patient  of mine.    Sincerely,      Clarene Reamer, MD  Electronically Signed    SML/MedQ  DD: 12/06/2006  DT: 12/06/2006  Job #: [id number]   CC:    and upcoming procedures and tests. Enclose copies of all prior  procedures

## 2011-04-01 NOTE — Assessment & Plan Note (Signed)
[place] OFFICE NOTE   NAME:WILLIS, JIMI GIZA                       MRN:          [id number]  DATE:12/06/2006                            DOB:          16-Jan-1961    Briseidy comes in after her previous procedures, and she was found to have  a large hiatal hernia, almost 10 cm from her endoscopy report.  Colonoscopy biopsies were all normal and shows no evidence of underlying  inflammatory disease.  She says she has these loose stools 2 and 3 times  daily.  I am more concerned about her anemia.  I want to recheck that  and get CBC, serum INR, __________ folate, B12 level, stool for occult  blood and if positive, I want to do a CPE on her.  In the meantime, I  will have her get a barium swallow for the anatomy to help the surgeon,  and I am going to refer her on to Dr. Kaylyn Lim for consideration of a  fundoplication.  I also want her to continue on her iron except during  the period when we want to do stools for occult blood.  I advised her to  do all these.  I think she is satisfied with this.  She wants to get it  done before my retirement date at the end of May.     Clarene Reamer, MD  Electronically Signed    SML/MedQ  DD: 12/06/2006  DT: 12/06/2006  Job #: [id number]   cc:   Isabel Caprice Hassell Done, MD

## 2011-06-07 ENCOUNTER — Encounter (INDEPENDENT_AMBULATORY_CARE_PROVIDER_SITE_OTHER): Payer: Self-pay | Admitting: General Surgery

## 2011-06-08 ENCOUNTER — Ambulatory Visit (INDEPENDENT_AMBULATORY_CARE_PROVIDER_SITE_OTHER): Payer: Self-pay | Admitting: Surgery

## 2011-06-08 DIAGNOSIS — Z9884 Bariatric surgery status: Secondary | ICD-10-CM

## 2011-06-08 NOTE — Progress Notes (Signed)
Joyce Kim and her new husband come in today in followup. Her weight is 221.6 so she's lost 4 pounds while she's been away on her honeymoon. She's having a little bit of nocturnal reflux but not enough to take fluid out of her band. She feels restricted and feels that she is brought where she needs to be. Occasionally she'll take TUMS for heartburn.  I think that we will continue to watch her and feel that she is appropriately restricted. I asked to see her back in 3 months.

## 2011-08-25 LAB — CBC
HCT: 31.3 — ABNORMAL LOW
HCT: 33.5 — ABNORMAL LOW
Hemoglobin: 10.1 — ABNORMAL LOW
Hemoglobin: 10.6 — ABNORMAL LOW
MCHC: 31.7
MCHC: 32.3
MCV: 71.2 — ABNORMAL LOW
MCV: 72 — ABNORMAL LOW
Platelets: 354
RBC: 4.65
RDW: 18.3 — ABNORMAL HIGH

## 2011-08-25 LAB — BASIC METABOLIC PANEL
CO2: 26
Chloride: 108
Creatinine, Ser: 0.98
GFR calc Af Amer: >60
Potassium: 4.2
Sodium: 141

## 2011-08-25 LAB — DIFFERENTIAL
Basophils Absolute: 0
Eosinophils Absolute: 0
Eosinophils Relative: 0
Monocytes Absolute: 0.5

## 2011-09-12 ENCOUNTER — Encounter (INDEPENDENT_AMBULATORY_CARE_PROVIDER_SITE_OTHER): Payer: Self-pay | Admitting: Surgery

## 2011-10-28 ENCOUNTER — Encounter (INDEPENDENT_AMBULATORY_CARE_PROVIDER_SITE_OTHER): Payer: Self-pay | Admitting: Surgery

## 2011-12-09 ENCOUNTER — Encounter: Payer: Self-pay | Admitting: Internal Medicine

## 2012-01-18 ENCOUNTER — Ambulatory Visit (INDEPENDENT_AMBULATORY_CARE_PROVIDER_SITE_OTHER): Payer: Self-pay | Admitting: Surgery

## 2012-03-19 ENCOUNTER — Encounter (INDEPENDENT_AMBULATORY_CARE_PROVIDER_SITE_OTHER): Payer: Self-pay | Admitting: Surgery

## 2012-03-22 ENCOUNTER — Encounter (INDEPENDENT_AMBULATORY_CARE_PROVIDER_SITE_OTHER): Payer: Self-pay | Admitting: Surgery

## 2012-05-16 ENCOUNTER — Ambulatory Visit (INDEPENDENT_AMBULATORY_CARE_PROVIDER_SITE_OTHER): Payer: 59 | Admitting: Surgery

## 2012-05-16 ENCOUNTER — Encounter (INDEPENDENT_AMBULATORY_CARE_PROVIDER_SITE_OTHER): Payer: Self-pay | Admitting: Surgery

## 2012-05-16 VITALS — BP 122/70 | Ht 66.5 in | Wt 226.0 lb

## 2012-05-16 DIAGNOSIS — Z9884 Bariatric surgery status: Secondary | ICD-10-CM

## 2012-05-16 NOTE — Progress Notes (Signed)
Joyce Kim Body mass index is 35.93 kg/(m^2).  Having regurgitation:  Yes..sometimes several hours later  Nocturnal reflux?  yes  Amount of fill  - 6 cc Joyce Kim has all the symptoms and anterior band slipped. She reports eating meats and spitting them up some 10 hours later. She also gets hiccups from time to time. I think that she needs a band vacation and went ahead and removed 6 cc from her band. Hopefully over the next 3 weeks this will allow this area to reduce itself. I'll see her back in and looked at filling her at that time.

## 2012-05-16 NOTE — Patient Instructions (Addendum)
1. Stay on liquids for the next 2 days as you adapt to your new fill volume.  Then resume your previous diet. 2. Decreasing your carbohydrate intake will hasten you weight loss.  Rely more on proteins for your meals.  Avoid condiments that contain sweets such as Honey Mustard and sugary salad dressings.   3. Stay in the "green zone".  If you are regurgitating with meals, having night time reflux, and find yourself eating soft comfort foods (mashed potatoes, potato chips)...realize that you are developing "maladaptive eating".  You will not lose weight this way and may regain weight.  The GREEN ZONE is eating smaller portions and not regurgitating.  Hence we may need to withdraw fluid from your band. 4. Build exercise into your daily routine.  Walking is the best way to start but do something every day if you can.      You are on a band vacation for 3 weeks.  I removed 6 cc from your band.  I think that you have an anterior slip and hopefully that will reduce with this relaxation.

## 2012-06-13 ENCOUNTER — Ambulatory Visit (INDEPENDENT_AMBULATORY_CARE_PROVIDER_SITE_OTHER): Payer: 59 | Admitting: Surgery

## 2012-06-13 ENCOUNTER — Encounter (INDEPENDENT_AMBULATORY_CARE_PROVIDER_SITE_OTHER): Payer: Self-pay | Admitting: Surgery

## 2012-06-13 VITALS — BP 122/78 | HR 76 | Temp 98.0°F | Resp 16 | Ht 66.5 in | Wt 233.2 lb

## 2012-06-13 DIAGNOSIS — Z9884 Bariatric surgery status: Secondary | ICD-10-CM

## 2012-06-13 NOTE — Progress Notes (Signed)
Joyce Kim Body mass index is 37.08 kg/(m^2).  Having regurgitation:  no  Nocturnal reflux?  no  Amount of fill  + 3  I took out 6 cc from her band saw her last on July 3. Since then she has had no nocturnal reflux or spitting up at all. We discussed beginning to refill her pain and and decided to start out with an additional 3 cc tube. I added 3 cc today and she was able to drink without difficulty. We'll see her back in 4 weeks

## 2012-06-13 NOTE — Patient Instructions (Signed)
1. Stay on liquids for the next 2 days as you adapt to your new fill volume.  Then resume your previous diet. 2. Decreasing your carbohydrate intake will hasten you weight loss.  Rely more on proteins for your meals.  Avoid condiments that contain sweets such as Honey Mustard and sugary salad dressings.   3. Stay in the "green zone".  If you are regurgitating with meals, having night time reflux, and find yourself eating soft comfort foods (mashed potatoes, potato chips)...realize that you are developing "maladaptive eating".  You will not lose weight this way and may regain weight.  The GREEN ZONE is eating smaller portions and not regurgitating.  Hence we may need to withdraw fluid from your band. 4. Build exercise into your daily routine.  Walking is the best way to start but do something every day if you can.

## 2012-07-19 ENCOUNTER — Encounter (INDEPENDENT_AMBULATORY_CARE_PROVIDER_SITE_OTHER): Payer: Self-pay | Admitting: Surgery

## 2012-07-19 ENCOUNTER — Ambulatory Visit (INDEPENDENT_AMBULATORY_CARE_PROVIDER_SITE_OTHER): Payer: 59 | Admitting: Surgery

## 2012-07-19 VITALS — BP 122/82 | HR 76 | Temp 98.3°F | Resp 16 | Ht 65.25 in | Wt 247.0 lb

## 2012-07-19 DIAGNOSIS — Z9884 Bariatric surgery status: Secondary | ICD-10-CM

## 2012-07-19 NOTE — Progress Notes (Signed)
Joyce Kim Body mass index is 40.79 kg/(m^2).  Having regurgitation:  no  Nocturnal reflux?  no  Amount of fill  2  She has been able to eat more and is gaining some weight.  I had taken out six cc and added back 3.  There fore I added  2 cc today.  She is recently started on hormone replacement therapy per Dory Horn.   I'll see her back in 6 weeks.

## 2012-07-19 NOTE — Patient Instructions (Signed)
1. Stay on liquids for the next 2 days as you adapt to your new fill volume.  Then resume your previous diet. 2. Decreasing your carbohydrate intake will hasten you weight loss.  Rely more on proteins for your meals.  Avoid condiments that contain sweets such as Honey Mustard and sugary salad dressings.   3. Stay in the "green zone".  If you are regurgitating with meals, having night time reflux, and find yourself eating soft comfort foods (mashed potatoes, potato chips)...realize that you are developing "maladaptive eating".  You will not lose weight this way and may regain weight.  The GREEN ZONE is eating smaller portions and not regurgitating.  Hence we may need to withdraw fluid from your band. 4. Build exercise into your daily routine.  Walking is the best way to start but do something every day if you can.

## 2012-08-17 ENCOUNTER — Encounter: Payer: Self-pay | Admitting: Internal Medicine

## 2012-08-31 ENCOUNTER — Encounter (INDEPENDENT_AMBULATORY_CARE_PROVIDER_SITE_OTHER): Payer: 59 | Admitting: Surgery

## 2012-10-16 ENCOUNTER — Encounter: Payer: Self-pay | Admitting: Internal Medicine

## 2013-07-12 ENCOUNTER — Encounter: Payer: Self-pay | Admitting: Internal Medicine

## 2013-09-13 LAB — IFOBT (OCCULT BLOOD): IFOBT: NEGATIVE

## 2013-12-23 ENCOUNTER — Ambulatory Visit (AMBULATORY_SURGERY_CENTER): Payer: Self-pay | Admitting: *Deleted

## 2013-12-23 VITALS — Ht 66.0 in | Wt 227.8 lb

## 2013-12-23 DIAGNOSIS — K2270 Barrett's esophagus without dysplasia: Secondary | ICD-10-CM

## 2013-12-23 NOTE — Progress Notes (Signed)
No allergies to eggs or soy. No problems with anesthesia.

## 2013-12-26 ENCOUNTER — Encounter: Payer: Self-pay | Admitting: Internal Medicine

## 2014-01-03 ENCOUNTER — Ambulatory Visit (AMBULATORY_SURGERY_CENTER): Payer: BC Managed Care – PPO | Admitting: Internal Medicine

## 2014-01-03 ENCOUNTER — Encounter: Payer: Self-pay | Admitting: Internal Medicine

## 2014-01-03 VITALS — BP 153/87 | HR 57 | Temp 98.2°F | Resp 20 | Ht 66.0 in | Wt 227.0 lb

## 2014-01-03 DIAGNOSIS — K2270 Barrett's esophagus without dysplasia: Secondary | ICD-10-CM

## 2014-01-03 MED ORDER — SODIUM CHLORIDE 0.9 % IV SOLN: 500.0000 mL | INTRAVENOUS | Status: DC

## 2014-01-03 NOTE — Progress Notes (Signed)
Called to room to assist during endoscopic procedure.  Patient ID and intended procedure confirmed with present staff. Received instructions for my participation in the procedure from the performing physician.  

## 2014-01-03 NOTE — Patient Instructions (Addendum)
I took biopsies of the Barrett's esophagus and will let you know the results and plans.  I had told you next routine colonoscopy in 2018 but after further chart review and hearing that you have been anemic - it is reasonable to arrange a colonoscopy now. I will discuss with you.  I appreciate the opportunity to care for you. Gatha Mayer, MD, FACG  YOU HAD AN ENDOSCOPIC PROCEDURE TODAY AT [facility]: Refer to the procedure report that was given to you for any specific questions about what was found during the examination.  If the procedure report does not answer your questions, please call your gastroenterologist to clarify.  If you requested that your care partner not be given the details of your procedure findings, then the procedure report has been included in a sealed envelope for you to review at your convenience later.  YOU SHOULD EXPECT: Some feelings of bloating in the abdomen. Passage of more gas than usual.  Walking can help get rid of the air that was put into your GI tract during the procedure and reduce the bloating. If you had a lower endoscopy (such as a colonoscopy or flexible sigmoidoscopy) you may notice spotting of blood in your stool or on the toilet paper. If you underwent a bowel prep for your procedure, then you may not have a normal bowel movement for a few days.  DIET: Your first meal following the procedure should be a light meal and then it is ok to progress to your normal diet.  A half-sandwich or bowl of soup is an example of a good first meal.  Heavy or fried foods are harder to digest and may make you feel nauseous or bloated.  Likewise meals heavy in dairy and vegetables can cause extra gas to form and this can also increase the bloating.  Drink plenty of fluids but you should avoid alcoholic beverages for 24 hours.  ACTIVITY: Your care partner should take you home directly after the procedure.  You should plan to take it easy, moving slowly for the  rest of the day.  You can resume normal activity the day after the procedure however you should NOT DRIVE or use heavy machinery for 24 hours (because of the sedation medicines used during the test).    SYMPTOMS TO REPORT IMMEDIATELY: A gastroenterologist can be reached at any hour.  During normal business hours, 8:30 AM to 5:00 PM Monday through Friday, call [phone].  After hours and on weekends, please call the GI answering service at [phone] who will take a message and have the physician on call contact you.   Following upper endoscopy (EGD)  Vomiting of blood or coffee ground material  New chest pain or pain under the shoulder blades  Painful or persistently difficult swallowing  New shortness of breath  Fever of 100F or higher  Black, tarry-looking stools  FOLLOW UP: If any biopsies were taken you will be contacted by phone or by letter within the next 1-3 weeks.  Call your gastroenterologist if you have not heard about the biopsies in 3 weeks.  Our staff will call the home number listed on your records the next business day following your procedure to check on you and address any questions or concerns that you may have at that time regarding the information given to you following your procedure. This is a courtesy call and so if there is no answer at the home number and we have not heard  from you through the emergency physician on call, we will assume that you have returned to your regular daily activities without incident.  SIGNATURES/CONFIDENTIALITY: You and/or your care partner have signed paperwork which will be entered into your electronic medical record.  These signatures attest to the fact that that the information above on your After Visit Summary has been reviewed and is understood.  Full responsibility of the confidentiality of this discharge information lies with you and/or your care-partner.  Recommendations Await pathology results May need to consider  repeating colonoscopy vs. routine in 2018-last done-2008

## 2014-01-03 NOTE — Progress Notes (Signed)
Procedure ends, to recovery, report given and VSS. 

## 2014-01-03 NOTE — Op Note (Signed)
[facility]  [address] [place], [id number]   ENDOSCOPY PROCEDURE REPORT  PATIENT: Joyce Kim, Joyce Kim  MR#: [id number] BIRTHDATE: [birth year] , 53  yrs. old GENDER: Female ENDOSCOPIST: Gatha Mayer, MD, [facility] PROCEDURE DATE:  01/03/2014 PROCEDURE:  EGD w/ biopsy ASA CLASS:     Class II INDICATIONS:  follow up of Barrett's esophagus. MEDICATIONS: propofol (Diprivan) 281m IV, MAC sedation, administered by CRNA, and These medications were titrated to patient response per physician's verbal order TOPICAL ANESTHETIC: none  DESCRIPTION OF PROCEDURE: After the risks benefits and alternatives of the procedure were thoroughly explained, informed consent was obtained.  The LB [id number] was introduced through the mouth and advanced to the second portion of the duodenum. Without limitations.  The instrument was slowly withdrawn as the mucosa was fully examined.    ESOPHAGUS: There was evidence of Barrett's esophagus 31 cm from the incisors.  Three subcentimeter tongues. Multiple biopsies were performed using cold forceps.  Sample sent for histology.  STOMACH: A lap band impression was found in the cardia characterized as healthy in appearance.  The remainder of the upper endoscopy exam was otherwise normal. Retroflexed views revealed no other abnormalities.     The scope was then withdrawn from the patient and the procedure completed.  COMPLICATIONS: There were no complications. ENDOSCOPIC IMPRESSION: 1.   There was evidence of Barrett's esophagus; multiple biopsies 2.   Banded gastroplasty was found in the cardia 3.   The remainder of the upper endoscopy exam was otherwise normal  RECOMMENDATIONS: 1.  Await pathology results 2.  May need to consider repeating colonoscopy vs.  routine in 2018 - last done 2008 - will discuss she reports recurrent anemia  eSigned:  CGatha Mayer MD, [facility]02/20/2015 2:59 PM  CSQ:[id number], MD and The  Patient

## 2014-01-06 ENCOUNTER — Telehealth: Payer: Self-pay | Admitting: *Deleted

## 2014-01-06 NOTE — Telephone Encounter (Signed)
  Follow up Call-  Call back number 01/03/2014  Post procedure Call Back phone  # [phone]  Permission to leave phone message Yes    [facility]

## 2014-01-13 NOTE — Progress Notes (Signed)
Quick Note:  Office: Let her know Barrett's still there but no worrisome features - repeat EGD 5 years  She told me she had iron-deficiency anemia again and so she may need a colonoscopy now vs. Routine in 2018 - please see if we can get labs from Dr. Dagmar Hait re: anemia, iron levels, any hemoccults?   LEC: - recall EGD 12/2018   No letter ______

## 2014-01-27 NOTE — Progress Notes (Signed)
Quick Note:  Let her know I saw labs and not anemic and no blood in stool so can wait til 2018 for colonoscopy ______

## 2015-05-06 ENCOUNTER — Encounter: Payer: Self-pay | Admitting: Gastroenterology

## 2015-05-06 ENCOUNTER — Encounter: Payer: Self-pay | Admitting: Internal Medicine

## 2016-07-21 ENCOUNTER — Encounter (INDEPENDENT_AMBULATORY_CARE_PROVIDER_SITE_OTHER): Payer: Self-pay

## 2016-07-21 ENCOUNTER — Encounter: Payer: Self-pay | Admitting: Gastroenterology

## 2016-07-21 ENCOUNTER — Ambulatory Visit (INDEPENDENT_AMBULATORY_CARE_PROVIDER_SITE_OTHER): Payer: BC Managed Care – PPO | Admitting: Internal Medicine

## 2016-07-21 VITALS — BP 116/84 | HR 64 | Ht 64.75 in | Wt 235.4 lb

## 2016-07-21 DIAGNOSIS — R1310 Dysphagia, unspecified: Secondary | ICD-10-CM

## 2016-07-21 DIAGNOSIS — K219 Gastro-esophageal reflux disease without esophagitis: Secondary | ICD-10-CM | POA: Diagnosis not present

## 2016-07-21 DIAGNOSIS — R079 Chest pain, unspecified: Secondary | ICD-10-CM | POA: Diagnosis not present

## 2016-07-21 NOTE — Progress Notes (Signed)
Assessment & Plan:    Encounter Diagnoses  Name Primary?  Marland Kitchen Dysphagia Yes  . Chest pain, unspecified chest pain type   . Gastroesophageal reflux disease, esophagitis presence not specified    She has experienced and complained of a lot of functional complaints similar to the symptoms over the years or at least they turned out to be functional based upon study she had. Given her prior surgery it is possible that she has a slipped fundoplication. With the lap band that seems less likely as that would anchored to but what she is describing could be related to that. She had the flu last year so perhaps she had a lot of coughing problems and could've loosened her hiatal hernia repair.  First test is going to be an upper GI series. I wonder see what going on i.e. the hiatal hernia etc. Pending that an upper GI endoscopy could be indicated. I don't think this has anything to do with her short segment Barrett's, the progression to cancer and problems like that is very unlikely. Her symptoms do sound like motility problems, so manometry could be indicated as well. Wonder if the LAP-BAND is too tight and triggering some of the symptoms.  I appreciate the opportunity to care for this patient. CC: Tivis Ringer, MD  Subjective:    Patient ID: Joyce Kim, female    DOB: 02/01/1961, 55 y.o.   MRN: [id number] Chief complaint: Swallowing problems belching hiccups tightness and upper chest and throat HPI The patient is a very nice 55 year old woman with a history of hiatal hernia repair and lap band surgery, she also has short segment Barrett's esophagus and GERD symptoms. She had an EGD in 2015 had short segment Barrett's no dysplasia. Since that time she had done well until last 6-8 months where she started having intermittent tightness in her upper chest burning up into her neck, difficulty swallowing solid foods on intermittent basis but could also have the symptoms without swallowing. Feels like  it's tight in her throat frequently. She is having increasing belching and hiccups and more heartburn. She is taking her PPI regularly now and also eating a lot of Tums. She denies any significant stressors out of the ordinary. 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 She wonders if it's time for another endoscopy. She had followed up with Dr. Hassell Done who did her surgery, and he suggested she see me in her primary care provider suggested that as well, and Dr. Hassell Done is waiting to hear what I think before he might do anything in follow-up. She did have fluid added to her lap band recently released in the past few months.  Medications, allergies, past medical history, past surgical history, family history and social history are reviewed and updated in the EMR.  Review of Systems As per history of present illness.    Objective:   Physical Exam @BP  116/84 (BP Location: Left Arm, Patient Position: Sitting, Cuff Size: Normal)   Pulse 64   Ht 5' 4.75" (1.645 m) Comment: height measured without shoes  Wt 235 lb 6 oz (106.8 kg)   BMI 39.47 kg/m @  General:  NAD Eyes:   anicteric Lungs:  clear Heart::  S1S2 no rubs, murmurs or gallops Abdomen:  soft and nontender, BS+ Ext:   no edema, cyanosis or clubbing    Data Reviewed:   2015 EGD and pathology results. 2011 upper GI series. It looks normal status  post hiatal hernia and lap band surgery. Recent primary care notes reviewed (Dr. Dagmar Hait)

## 2016-07-21 NOTE — Patient Instructions (Addendum)
  You have been scheduled for an Upper GI Series at [facility]. Your appointment is on _9/29/17__ at __9:30AM. Please arrive 15 minutes prior to your test for registration. Make sure not to eat or drink anything after midnight on the night before your test. If you need to reschedule, please call radiology at [phone]. ________________________________________________________________ An upper GI series uses x rays to help diagnose problems of the upper GI tract, which includes the esophagus, stomach, and duodenum. The duodenum is the first part of the small intestine. An upper GI series is conducted by a radiology technologist or a radiologist-a doctor who specializes in x-ray imaging-at a hospital or outpatient center. While sitting or standing in front of an x-ray machine, the patient drinks barium liquid, which is often white and has a chalky consistency and taste. The barium liquid coats the lining of the upper GI tract and makes signs of disease show up more clearly on x rays. X-ray video, called fluoroscopy, is used to view the barium liquid moving through the esophagus, stomach, and duodenum. Additional x rays and fluoroscopy are performed while the patient lies on an x-ray table. To fully coat the upper GI tract with barium liquid, the technologist or radiologist may press on the abdomen or ask the patient to change position. Patients hold still in various positions, allowing the technologist or radiologist to take x rays of the upper GI tract at different angles. If a technologist conducts the upper GI series, a radiologist will later examine the images to look for problems.  This test typically takes about 1 hour to complete. __________________________________________________________________   I appreciate the opportunity to care for you. Silvano Rusk, MD, [facility]

## 2016-08-12 ENCOUNTER — Ambulatory Visit (HOSPITAL_COMMUNITY): Payer: BC Managed Care – PPO

## 2016-08-12 ENCOUNTER — Ambulatory Visit (HOSPITAL_COMMUNITY)
Admission: RE | Admit: 2016-08-12 | Discharge: 2016-08-12 | Disposition: A | Discharge status: Home / Self Care | Payer: BC Managed Care – PPO | Source: Ambulatory Visit | Attending: Internal Medicine | Admitting: Internal Medicine

## 2016-08-12 DIAGNOSIS — Z9884 Bariatric surgery status: Secondary | ICD-10-CM | POA: Insufficient documentation

## 2016-08-12 DIAGNOSIS — K228 Other specified diseases of esophagus: Secondary | ICD-10-CM | POA: Insufficient documentation

## 2016-08-12 DIAGNOSIS — R079 Chest pain, unspecified: Secondary | ICD-10-CM | POA: Diagnosis present

## 2016-08-12 DIAGNOSIS — K224 Dyskinesia of esophagus: Secondary | ICD-10-CM | POA: Diagnosis not present

## 2016-08-12 DIAGNOSIS — R1310 Dysphagia, unspecified: Secondary | ICD-10-CM

## 2016-08-16 NOTE — Progress Notes (Signed)
The Upper GI suggests lap band is tight and that fundoplication may have come undone some Also looks like esophagus may not squeeze properly  I recommend she get an appointment with Dr. Hassell Done in follow-up about this - I will cc him on this report also  I will stand by and wait to see what Dr. Hassell Done wants me to do - if anything

## 2016-10-13 ENCOUNTER — Other Ambulatory Visit (HOSPITAL_COMMUNITY): Payer: Self-pay | Admitting: Surgery

## 2016-10-13 DIAGNOSIS — Z9884 Bariatric surgery status: Secondary | ICD-10-CM

## 2016-11-03 ENCOUNTER — Ambulatory Visit (HOSPITAL_COMMUNITY)
Admission: RE | Admit: 2016-11-03 | Discharge: 2016-11-03 | Disposition: A | Discharge status: Home / Self Care | Payer: BC Managed Care – PPO | Source: Ambulatory Visit | Attending: Surgery | Admitting: Surgery

## 2016-11-03 ENCOUNTER — Other Ambulatory Visit (HOSPITAL_COMMUNITY): Payer: Self-pay | Admitting: Surgery

## 2016-11-03 DIAGNOSIS — R079 Chest pain, unspecified: Secondary | ICD-10-CM | POA: Diagnosis present

## 2016-11-03 DIAGNOSIS — Z9884 Bariatric surgery status: Secondary | ICD-10-CM

## 2017-01-16 ENCOUNTER — Encounter (HOSPITAL_COMMUNITY): Payer: Self-pay | Admitting: *Deleted

## 2017-01-17 ENCOUNTER — Encounter (HOSPITAL_COMMUNITY)
Admission: RE | Disposition: A | Discharge status: Home / Self Care | Payer: Self-pay | Source: Ambulatory Visit | Attending: Surgery

## 2017-01-17 ENCOUNTER — Ambulatory Visit (HOSPITAL_COMMUNITY): Payer: BC Managed Care – PPO | Admitting: Anesthesiology

## 2017-01-17 ENCOUNTER — Ambulatory Visit (HOSPITAL_COMMUNITY)
Admission: RE | Admit: 2017-01-17 | Discharge: 2017-01-17 | Disposition: A | Discharge status: Home / Self Care | Payer: BC Managed Care – PPO | Source: Ambulatory Visit | Attending: Surgery | Admitting: Surgery

## 2017-01-17 ENCOUNTER — Encounter (HOSPITAL_COMMUNITY): Payer: Self-pay | Admitting: *Deleted

## 2017-01-17 DIAGNOSIS — I4510 Unspecified right bundle-branch block: Secondary | ICD-10-CM | POA: Diagnosis not present

## 2017-01-17 DIAGNOSIS — B9681 Helicobacter pylori [H. pylori] as the cause of diseases classified elsewhere: Secondary | ICD-10-CM | POA: Diagnosis not present

## 2017-01-17 DIAGNOSIS — Z9889 Other specified postprocedural states: Secondary | ICD-10-CM | POA: Diagnosis not present

## 2017-01-17 DIAGNOSIS — Z6839 Body mass index (BMI) 39.0-39.9, adult: Secondary | ICD-10-CM | POA: Diagnosis not present

## 2017-01-17 DIAGNOSIS — Z79899 Other long term (current) drug therapy: Secondary | ICD-10-CM | POA: Insufficient documentation

## 2017-01-17 DIAGNOSIS — Z87442 Personal history of urinary calculi: Secondary | ICD-10-CM | POA: Diagnosis not present

## 2017-01-17 DIAGNOSIS — Z9884 Bariatric surgery status: Secondary | ICD-10-CM | POA: Insufficient documentation

## 2017-01-17 DIAGNOSIS — M19079 Primary osteoarthritis, unspecified ankle and foot: Secondary | ICD-10-CM | POA: Diagnosis not present

## 2017-01-17 DIAGNOSIS — K449 Diaphragmatic hernia without obstruction or gangrene: Secondary | ICD-10-CM | POA: Insufficient documentation

## 2017-01-17 DIAGNOSIS — N3281 Overactive bladder: Secondary | ICD-10-CM | POA: Diagnosis not present

## 2017-01-17 DIAGNOSIS — Z9049 Acquired absence of other specified parts of digestive tract: Secondary | ICD-10-CM | POA: Insufficient documentation

## 2017-01-17 DIAGNOSIS — E039 Hypothyroidism, unspecified: Secondary | ICD-10-CM | POA: Insufficient documentation

## 2017-01-17 DIAGNOSIS — K219 Gastro-esophageal reflux disease without esophagitis: Secondary | ICD-10-CM | POA: Diagnosis present

## 2017-01-17 DIAGNOSIS — Z7982 Long term (current) use of aspirin: Secondary | ICD-10-CM | POA: Insufficient documentation

## 2017-01-17 DIAGNOSIS — R1310 Dysphagia, unspecified: Secondary | ICD-10-CM | POA: Diagnosis present

## 2017-01-17 DIAGNOSIS — Z791 Long term (current) use of non-steroidal anti-inflammatories (NSAID): Secondary | ICD-10-CM | POA: Insufficient documentation

## 2017-01-17 DIAGNOSIS — M19049 Primary osteoarthritis, unspecified hand: Secondary | ICD-10-CM | POA: Insufficient documentation

## 2017-01-17 DIAGNOSIS — K2270 Barrett's esophagus without dysplasia: Secondary | ICD-10-CM | POA: Diagnosis not present

## 2017-01-17 HISTORY — DX: Personal history of urinary calculi: Z87.442

## 2017-01-17 HISTORY — PX: ESOPHAGOGASTRODUODENOSCOPY (EGD) WITH PROPOFOL: SHX5813

## 2017-01-17 SURGERY — ESOPHAGOGASTRODUODENOSCOPY (EGD) WITH PROPOFOL
Anesthesia: Monitor Anesthesia Care

## 2017-01-17 MED ORDER — PROPOFOL 500 MG/50ML IV EMUL
INTRAVENOUS | Status: DC | PRN
  Administered 2017-01-17: 100 ug/kg/min via INTRAVENOUS

## 2017-01-17 MED ORDER — LACTATED RINGERS IV SOLN
INTRAVENOUS | Status: DC
  Administered 2017-01-17: 1000 mL via INTRAVENOUS

## 2017-01-17 MED ORDER — PROPOFOL 500 MG/50ML IV EMUL
INTRAVENOUS | Status: DC | PRN
Start: 2017-01-17 — End: 2017-01-17

## 2017-01-17 MED ORDER — PROPOFOL 10 MG/ML IV BOLUS
INTRAVENOUS | Status: AC
  Filled 2017-01-17: qty 20

## 2017-01-17 MED ORDER — PROPOFOL 10 MG/ML IV BOLUS
INTRAVENOUS | Status: AC
  Filled 2017-01-17: qty 40

## 2017-01-17 MED ORDER — PROPOFOL 500 MG/50ML IV EMUL
INTRAVENOUS | Status: DC | PRN
  Administered 2017-01-17: 50 mg via INTRAVENOUS
  Administered 2017-01-17: 20 mg via INTRAVENOUS
  Administered 2017-01-17: 10 mg via INTRAVENOUS
  Administered 2017-01-17: 20 mg via INTRAVENOUS

## 2017-01-17 NOTE — Anesthesia Postprocedure Evaluation (Signed)
Anesthesia Post Note  Patient: Joyce Kim  Procedure(s) Performed: Procedure(s) (LRB): ESOPHAGOGASTRODUODENOSCOPY (EGD) WITH PROPOFOL (N/A)  Patient location during evaluation: PACU Anesthesia Type: MAC Level of consciousness: awake and alert and oriented Pain management: pain level controlled Vital Signs Assessment: post-procedure vital signs reviewed and stable Respiratory status: spontaneous breathing, nonlabored ventilation and respiratory function stable Cardiovascular status: stable and blood pressure returned to baseline Postop Assessment: no signs of nausea or vomiting Anesthetic complications: no       Last Vitals:  Vitals:   01/17/17 1156 01/17/17 1310  BP: (!) 142/69 106/63  Pulse: (!) 57 (!) 58  Resp: 16 10  Temp: 36.6 C 36.6 C    Last Pain:  Vitals:   01/17/17 1310  TempSrc: Oral                 Hennessy Bartel A.

## 2017-01-17 NOTE — Op Note (Signed)
Rakeisha Nyce [id number] [birth year] 01/17/2017  Preoperative diagnosis: dysphagia and GERD with lapband  Postoperative diagnosis: hiatal hernia above lapband;  No erosion  Procedure: Upper endoscopy and clo test biopsy  Surgeon: Catalina Antigua B. Hassell Done  M.D., FACS   Anesthesia: Gen.   Indications for procedure: Repair large type III hiatal hernia with biomesh and lapband placement in 2008-now with GERD and dysphagia.    Description of procedure: The endoscopy was placed in the mouth and into the oropharynx and under endoscopic vision it was advanced to the esophagogastric junction.  The esophagus was mildly dilated.  The EG junction was above the small pouch and likely resides in the chest (by UGI).  The band was patulous and no erosion.  There was some bile reflux noted.   Antral biopsy for clo test . The scope was withdrawn without issue.    Assessment:  Small hiatal hernia above the lapband.  No erosion of lapband.       Matt B. Hassell Done, MD, FACS General, Bariatric, & Minimally Invasive Surgery [facility], [place]

## 2017-01-17 NOTE — Anesthesia Preprocedure Evaluation (Addendum)
Anesthesia Evaluation  Patient identified by MRN, date of birth, ID band Patient awake    Reviewed: Allergy & Precautions, NPO status , Patient's Chart, lab work & pertinent test results  Airway Mallampati: I  TM Distance: >3 FB Neck ROM: Full    Dental no notable dental hx. (+) Teeth Intact, Caps   Pulmonary    Pulmonary exam normal breath sounds clear to auscultation       Cardiovascular Normal cardiovascular exam+ dysrhythmias  Rhythm:Regular Rate:Normal     Neuro/Psych  Neuromuscular disease    GI/Hepatic Neg liver ROS, GERD  Controlled and Medicated,Hx/o Barrett's esophagus   Endo/Other  Hypothyroidism Morbid obesity  Renal/GU negative Renal ROS Bladder dysfunction  Overactive bladder negative genitourinary   Musculoskeletal  (+) Arthritis , Osteoarthritis,    Abdominal (+) + obese,   Peds  Hematology  (+) anemia ,   Anesthesia Other Findings   Reproductive/Obstetrics                             Anesthesia Physical Anesthesia Plan  ASA: III  Anesthesia Plan: MAC   Post-op Pain Management:    Induction: Intravenous  Airway Management Planned: Natural Airway and Nasal Cannula  Additional Equipment:   Intra-op Plan:   Post-operative Plan:   Informed Consent: I have reviewed the patients History and Physical, chart, labs and discussed the procedure including the risks, benefits and alternatives for the proposed anesthesia with the patient or authorized representative who has indicated his/her understanding and acceptance.     Plan Discussed with: CRNA, Anesthesiologist and Surgeon  Anesthesia Plan Comments:         Anesthesia Quick Evaluation

## 2017-01-17 NOTE — Discharge Instructions (Signed)
Esophagogastroduodenoscopy, Care After Refer to this sheet in the next few weeks. These instructions provide you with information about caring for yourself after your procedure. Your health care provider may also give you more specific instructions. Your treatment has been planned according to current medical practices, but problems sometimes occur. Call your health care provider if you have any problems or questions after your procedure. What can I expect after the procedure? After the procedure, it is common to have:  A sore throat.  Nausea.  Bloating.  Dizziness.  Fatigue. Follow these instructions at home:  Do not eat or drink anything until the numbing medicine (local anesthetic) has worn off and your gag reflex has returned. You will know that the local anesthetic has worn off when you can swallow comfortably.  Do not drive for 24 hours if you received a medicine to help you relax (sedative).  If your health care provider took a tissue sample for testing during the procedure, make sure to get your test results. This is your responsibility. Ask your health care provider or the department performing the test when your results will be ready.  Keep all follow-up visits as told by your health care provider. This is important. Contact a health care provider if:  You cannot stop coughing.  You are not urinating.  You are urinating less than usual. Get help right away if:  You have trouble swallowing.  You cannot eat or drink.  You have throat or chest pain that gets worse.  You are dizzy or light-headed.  You faint.  You have nausea or vomiting.  You have chills.  You have a fever.  You have severe abdominal pain.  You have black, tarry, or bloody stools. This information is not intended to replace advice given to you by your health care provider. Make sure you discuss any questions you have with your health care provider. Document Released: 10/17/2012 Document  Revised: 04/07/2016 Document Reviewed: 09/24/2015 Elsevier Interactive Patient Education  2017 [employer].

## 2017-01-17 NOTE — H&P (Signed)
Chief Complaint:  Dysphagia with prior lapband and hiatal hernia repair  History of Present Illness:  Joyce Kim is an 56 y.o. female on whom I performed a repair of a large type III mixed hiatal hernia and placement of a lapband.    Past Medical History:  Diagnosis Date  . Anemia   . Arthritis   . Barrett esophagus   . Blood transfusion abn reaction or complication, no procedure mishap 1968   after kidney surgery  . DJD (degenerative joint disease)    fingers and ankle  . History of kidney stones   . Hypothyroidism   . Obesity   . Overactive bladder   . Plantar fasciitis   . RBBB   . Reflux   . Ulcer [facility])     Past Surgical History:  Procedure Laterality Date  . CHOLECYSTECTOMY  2002  . COLONOSCOPY    . KIDNEY SURGERY  1966  . LAPAROSCOPIC GASTRIC BANDING  08/07/07   repair hiatal hernia  . UPPER GASTROINTESTINAL ENDOSCOPY      Current Facility-Administered Medications  Medication Dose Route Frequency Provider Last Rate Last Dose  . lactated ringers infusion   Intravenous Continuous Johnathan Hausen, MD 10 mL/hr at 01/17/17 1207 1,000 mL at 01/17/17 1207   Sulfonamide derivatives; Contrast media [iodinated diagnostic agents]; and Doxycycline Family History  Problem Relation Age of Onset  . Melanoma Brother   . GI problems Mother   . Peripheral vascular disease Mother   . Hyperlipidemia Mother   . Hiatal hernia Mother   . Coronary artery disease Mother   . GER disease Father   . Hyperlipidemia Father   . Colon cancer Neg Hx    Social History:   reports that she has never smoked. She has never used smokeless tobacco. She reports that she drinks alcohol. She reports that she does not use drugs.   REVIEW OF SYSTEMS : Negative except for see problem list  Physical Exam:   Blood pressure (!) 142/69, pulse (!) 57, temperature 97.8 F (36.6 C), temperature source Oral, resp. rate 16, height 5' 4.5" (1.638 m), weight 106.6 kg (235 lb), SpO2 99 %. Body  mass index is 39.71 kg/m.  Gen:  WDWN WF NAD  Neurological: Alert and oriented to person, place, and time. Motor and sensory function is grossly intact  Head: Normocephalic and atraumatic.  Eyes: Conjunctivae are normal. Pupils are equal, round, and reactive to light. No scleral icterus.  Neck: Normal range of motion. Neck supple. No tracheal deviation or thyromegaly present.  Cardiovascular:  SR without murmurs or gallops.  No carotid bruits Breast:  Not examined Respiratory: Effort normal.  No respiratory distress. No chest wall tenderness. Breath sounds normal.  No wheezes, rales or rhonchi.  Abdomen:  Non tender GU:  Not examined.   Musculoskeletal: Normal range of motion. Extremities are nontender. No cyanosis, edema or clubbing noted Lymphadenopathy: No cervical, preauricular, postauricular or axillary adenopathy is present Skin: Skin is warm and dry. No rash noted. No diaphoresis. No erythema. No pallor. Pscyh: Normal mood and affect. Behavior is normal. Judgment and thought content normal.   LABORATORY RESULTS: No results found for this or any previous visit (from the past 48 hour(s)).   RADIOLOGY RESULTS: No results found.  Problem List: Patient Active Problem List   Diagnosis Date Noted  . 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 ANEMIA-IRON DEFICIENCY 11/25/2009  . GERD 11/25/2009  . BARRETT'S ESOPHAGUS 11/25/2009  Assessment & Plan: Dysphagia :  Plan upper endoscopy    Matt B. Hassell Done, MD, [facility] [phone] beeper [phone]  01/17/2017 12:12 PM

## 2017-01-17 NOTE — Transfer of Care (Signed)
Immediate Anesthesia Transfer of Care Note  Patient: Joyce Kim  Procedure(s) Performed: Procedure(s): ESOPHAGOGASTRODUODENOSCOPY (EGD) WITH PROPOFOL (N/A)  Patient Location: PACU  Anesthesia Type:MAC  Level of Consciousness: awake, alert  and oriented  Airway & Oxygen Therapy: Patient Spontanous Breathing and Patient connected to nasal cannula oxygen  Post-op Assessment: Report given to RN and Post -op Vital signs reviewed and stable  Post vital signs: Reviewed and stable  Last Vitals:  Vitals:   01/17/17 1156  BP: (!) 142/69  Pulse: (!) 57  Resp: 16  Temp: 36.6 C    Last Pain:  Vitals:   01/17/17 1156  TempSrc: Oral         Complications: No apparent anesthesia complications

## 2017-01-18 ENCOUNTER — Encounter (HOSPITAL_COMMUNITY): Payer: Self-pay | Admitting: Surgery

## 2017-01-18 LAB — CLOTEST (H. PYLORI), BIOPSY: Helicobacter screen: NEGATIVE

## 2017-07-10 ENCOUNTER — Telehealth: Payer: Self-pay

## 2017-07-10 NOTE — Telephone Encounter (Signed)
Patient wants to come in and discuss her issues before setting up a colonoscopy.  Appointment made for 08/02/17 at 8:30AM.

## 2017-07-10 NOTE — Telephone Encounter (Signed)
-----   Message from Gatha Mayer, MD sent at 07/07/2017  4:05 PM EDT ----- Regarding: can be direct colon I see she is coming for heme + stool  She will need a colonoscopy  Unless there is something else she wants to review can have a direct colonoscopy and avoid OV copay etc - we can also discuss other things at the colonoscopy if she wants  Her choice - just offering

## 2017-08-02 ENCOUNTER — Ambulatory Visit (INDEPENDENT_AMBULATORY_CARE_PROVIDER_SITE_OTHER): Payer: BC Managed Care – PPO | Admitting: Internal Medicine

## 2017-08-02 ENCOUNTER — Encounter: Payer: Self-pay | Admitting: Internal Medicine

## 2017-08-02 VITALS — BP 120/88 | HR 68 | Ht 64.0 in | Wt 269.0 lb

## 2017-08-02 DIAGNOSIS — R195 Other fecal abnormalities: Secondary | ICD-10-CM | POA: Diagnosis not present

## 2017-08-02 DIAGNOSIS — R197 Diarrhea, unspecified: Secondary | ICD-10-CM

## 2017-08-02 NOTE — Patient Instructions (Addendum)
If you are age 55 or older, your body mass index should be between 23-30. Your Body mass index is 46.17 kg/m. If this is out of the aforementioned range listed, please consider follow up with your Primary Care Provider.  If you are age 77 or younger, your body mass index should be between 19-25. Your Body mass index is 46.17 kg/m. If this is out of the aformentioned range listed, please consider follow up with your Primary Care Provider.   You have been scheduled for a colonoscopy. Please follow written instructions given to you at your visit today.  Please pick up your prep supplies at the pharmacy within the next 1-3 days. If you use inhalers (even only as needed), please bring them with you on the day of your procedure. Your physician has requested that you go to www.startemmi.com and enter the access code given to you at your visit today. This web site gives a general overview about your procedure. However, you should still follow specific instructions given to you by our office regarding your preparation for the procedure.  Please use VSL(Probiotic) samples once daily.  You may take 1/2 of an Immodium daily as needed for diarrhea.  You have been given a sample of Clenpiq to prep for your colonoscopy.  Thank you.

## 2017-08-02 NOTE — Progress Notes (Signed)
Joyce Kim [name] 56 y.o. March 10, 1961 [id number] Referred by: Prince Solian, MD  Assessment & Plan:   Encounter Diagnoses  Name Primary?  . Heme + stool Yes  . Diarrhea, unspecified type     Colonoscopy is appropriate about it. Heme-positive stool. Change in bowel habits sounds like irritable bowel syndrome. CBC is normal which is reassuring.The risks and benefits as well as alternatives of endoscopic procedure(s) have been discussed and reviewed. All questions answered. The patient agrees to proceed.   Trial of VSL #3 low-dose daily for what I think is IBS and its also okay to use Imodium perhaps a half a day or every other day. Further plans pending colonoscopy results.  I appreciate the opportunity to care for this patient. CC: Avva, Ravisankar, MD  Subjective:   Chief Complaint: Heme positive stool and change in bowel habits HPI The patient is a very nice lady I know from history of Barrett's esophagus, she also has a lap band in place and has had some dysphagia problems, and not too long ago had a heme positive stool, immune fecal occult blood test. She's not having any rectal bleeding. She has for several months been experiencing intermittent diarrhea, sometimes urgent, with some mild cramps, not much pain, but has to take loperamide one or 2 to control the diarrhea. After she does that she will not move her bowels for several days and the cycle will repeat itself. Most of the stools are soft to loose not really formed. No change in diet or medications. 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 She has a long history of intermittent anemia that is seems to have resolved. Menses stopped in late 44s. She says that between Dr. Velora Heckler and me and the evaluations nobody is ever found a cause of the "bleeding" though I don't know that she's really ever had clear documented  blood loss anemia. Allergies  Allergen Reactions  . Sulfonamide Derivatives Shortness Of Breath and Rash  . Contrast Media [Iodinated Diagnostic Agents]   . Doxycycline Nausea And Vomiting   Current Meds  Medication Sig  . aspirin 81 MG tablet Take 81 mg by mouth daily.  Marland Kitchen levothyroxine (SYNTHROID, LEVOTHROID) 50 MCG tablet Take 50 mcg by mouth daily before breakfast.  . meloxicam (MOBIC) 15 MG tablet Take 15 mg by mouth daily.   . Multiple Vitamin (MULTIVITAMIN) capsule Take 1 capsule by mouth daily.    . Nutritional Supplements (ESTROVEN PO) Take 1 tablet by mouth daily.  Marland Kitchen omeprazole (PRILOSEC) 40 MG capsule Take 40 mg by mouth daily.   . ranitidine (ZANTAC) 75 MG tablet Take 75 mg by mouth at bedtime.   Past Medical History:  Diagnosis Date  . Anemia   . Arthritis   . Barrett esophagus   . Blood transfusion abn reaction or complication, no procedure mishap 1968   after kidney surgery  . DJD (degenerative joint disease)    fingers and ankle  . H. pylori infection   . History of kidney stones   . Hypothyroidism   . Obesity   . Onychomycosis   . Overactive bladder   . Plantar fasciitis   . RBBB   . Reflux   . Ulcer    Past Surgical History:  Procedure Laterality Date  . CHOLECYSTECTOMY  2002  . COLONOSCOPY    . ESOPHAGOGASTRODUODENOSCOPY (EGD) WITH PROPOFOL N/A 01/17/2017   Procedure: ESOPHAGOGASTRODUODENOSCOPY (  EGD) WITH PROPOFOL;  Surgeon: Johnathan Hausen, MD;  Location: [facility];  Service: General;  Laterality: N/A;  . KIDNEY SURGERY  1966  . LAPAROSCOPIC GASTRIC BANDING  08/07/2007   repair hiatal hernia, Dr Hassell Done  . UPPER GASTROINTESTINAL ENDOSCOPY     Social History   Social History  . Marital status: Married    Spouse name: Richard  .    Marland Kitchen Years of education: 7   Occupational History  . Biology teacher [employer]   Social History Main Topics  . Smoking status: Never Smoker  . Smokeless tobacco: Never Used  . Alcohol use Yes      Comment: 1 -2 drinks per month  . 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   Review of Systems As per history of present illness. She's had some toenail fungus concerns, sciatica-like symptoms stress in her marriage.  Objective:   Physical Exam @BP  120/88 (Cuff Size: Large)   Pulse 68   Ht 5' 4"  (1.626 m)   Wt 269 lb (122 kg)   BMI 46.17 kg/m @  General:  Well-developed, well-nourished and in no acute distress she is obese Eyes:  anicteric. Lungs: Clear to auscultation bilaterally. Heart:  S1S2, no rubs, murmurs, gallops. Abdomen:  soft, non-tender, no hepatosplenomegaly, hernia, or mass and BS+.  Rectal: Deferred until colonoscopy Extremities:   no cyanosis or clubbing Neuro:  A&O x 3.  Psych:  appropriate mood and  Affect.   Data Reviewed: Primary care note from Dr. Dagmar Hait of 05/29/2017. Labs show comprehensive metabolic panel normal, CBC normal with hemoglobin 13.5 and MCV 87 and a normal TSH and free T4.

## 2017-08-04 ENCOUNTER — Telehealth: Payer: Self-pay | Admitting: Internal Medicine

## 2017-08-04 NOTE — Telephone Encounter (Signed)
Questions answered.

## 2017-08-09 ENCOUNTER — Telehealth: Payer: Self-pay

## 2017-08-09 MED ORDER — SOD PICOSULFATE-MAG OX-CIT ACD 10-3.5-12 MG-GM -GM/160ML PO SOLN: 1.0000 | Freq: Once | ORAL | 0 refills | Status: AC

## 2017-08-09 MED ORDER — NA SULFATE-K SULFATE-MG SULF 17.5-3.13-1.6 GM/177ML PO SOLN: 1.0000 | Freq: Once | ORAL | 0 refills | Status: AC

## 2017-08-09 NOTE — Telephone Encounter (Signed)
Called Pharmacy and had them run a free [employer].  Pharmacist is ordering the prep and it can be picked up tomorrow at no charge.  Left message and relayed all this information.

## 2017-09-05 ENCOUNTER — Encounter: Payer: Self-pay | Admitting: Internal Medicine

## 2017-09-19 ENCOUNTER — Encounter: Payer: Self-pay | Admitting: Internal Medicine

## 2017-09-19 ENCOUNTER — Ambulatory Visit (AMBULATORY_SURGERY_CENTER): Payer: BC Managed Care – PPO | Admitting: Internal Medicine

## 2017-09-19 VITALS — BP 117/78 | HR 50 | Temp 98.0°F | Resp 15 | Ht 64.0 in | Wt 269.0 lb

## 2017-09-19 DIAGNOSIS — K5150 Left sided colitis without complications: Secondary | ICD-10-CM | POA: Diagnosis not present

## 2017-09-19 DIAGNOSIS — R195 Other fecal abnormalities: Secondary | ICD-10-CM | POA: Diagnosis not present

## 2017-09-19 DIAGNOSIS — K51819 Other ulcerative colitis with unspecified complications: Secondary | ICD-10-CM | POA: Diagnosis not present

## 2017-09-19 DIAGNOSIS — K5190 Ulcerative colitis, unspecified, without complications: Secondary | ICD-10-CM | POA: Diagnosis not present

## 2017-09-19 MED ORDER — SODIUM CHLORIDE 0.9 % IV SOLN: 500.0000 mL | INTRAVENOUS | Status: DC

## 2017-09-19 MED ORDER — MESALAMINE 1.2 G PO TBEC: 2.4000 g | DELAYED_RELEASE_TABLET | Freq: Every day | ORAL | 3 refills | Status: DC

## 2017-09-19 NOTE — Progress Notes (Signed)
Called to room to assist during endoscopic procedure.  Patient ID and intended procedure confirmed with present staff. Received instructions for my participation in the procedure from the performing physician.  

## 2017-09-19 NOTE — Op Note (Addendum)
[place] Patient Name: Joyce Kim [facility] Procedure Date: 09/19/2017 3:24 PM MRN: [id number] Endoscopist: Gatha Mayer , MD Age: 56 Referring MD:  Date of Birth: 10/18/1961 Gender: Female Account #: [id number] Procedure:                Colonoscopy Indications:              Chronic diarrhea, Heme positive stool Medicines:                Propofol per Anesthesia, Monitored Anesthesia Care Procedure:                Pre-Anesthesia Assessment:                           - Prior to the procedure, a History and Physical                            was performed, and patient medications and                            allergies were reviewed. The patient's tolerance of                            previous anesthesia was also reviewed. The risks                            and benefits of the procedure and the sedation                            options and risks were discussed with the patient.                            All questions were answered, and informed consent                            was obtained. Prior Anticoagulants: The patient has                            taken no previous anticoagulant or antiplatelet                            agents. ASA Grade Assessment: III - A patient with                            severe systemic disease. After reviewing the risks                            and benefits, the patient was deemed in                            satisfactory condition to undergo the procedure.                           After obtaining informed consent, the colonoscope  was passed under direct vision. Throughout the                            procedure, the patient's blood pressure, pulse, and                            oxygen saturations were monitored continuously. The                            Colonoscope was introduced through the anus and                            advanced to the the terminal ileum, with      identification of the appendiceal orifice and IC                            valve. The colonoscopy was performed without                            difficulty. The patient tolerated the procedure                            well. The quality of the bowel preparation was                            excellent. The terminal ileum, ileocecal valve,                            appendiceal orifice, and rectum were photographed.                            The bowel preparation used was Miralax. Scope In: 3:37:31 PM Scope Out: 3:48:19 PM Scope Withdrawal Time: 0 hours 7 minutes 36 seconds  Total Procedure Duration: 0 hours 10 minutes 48 seconds  Findings:                 The perianal and digital rectal examinations were                            normal.                           Inflammation characterized by congestion (edema),                            erosions, erythema, friability, granularity, loss                            of vascularity and mucus was found. The descending                            colon, the transverse colon and the ascending colon                            were spared. This was moderate in  severity.                            Biopsies were taken with a cold forceps for                            histology. Verification of patient identification                            for the specimen was done. Estimated blood loss was                            minimal.                           The exam was otherwise without abnormality on                            direct and retroflexion views.                           The terminal ileum appeared normal. Complications:            No immediate complications. Estimated Blood Loss:     Estimated blood loss was minimal. Impression:               - Ulcerative colitis. Inflammation was found. This                            was moderate in severity. Biopsied.                           - The examination was otherwise normal on  direct                            and retroflexion views.                           - The examined portion of the ileum was normal. Recommendation:           - Patient has a contact number available for                            emergencies. The signs and symptoms of potential                            delayed complications were discussed with the                            patient. Return to normal activities tomorrow.                            Written discharge instructions were provided to the                            patient.                           -  Resume previous diet.                           - Continue present medications.                           - Await pathology results.                           - Lialda 2.4 g daily                           - Repeat colonoscopy is recommended [Repeat                            reason]. The colonoscopy date will be determined                            after pathology results from today's exam become                            available for review. Gatha Mayer, MD 09/19/2017 3:57:35 PM This report has been signed electronically.

## 2017-09-19 NOTE — Progress Notes (Signed)
Report to PACU, RN, vss, BBS= Clear.  

## 2017-09-19 NOTE — Progress Notes (Signed)
Have not been outside of [place] In last 21 days.

## 2017-09-19 NOTE — Patient Instructions (Addendum)
   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 Yours is active in the rectum and end of colon and the beginning of the colon.  Will start treatment with Lialda 2.4 g (2 tablets) daily  I will notify you with results. You may still use Imodium AD but I bet you won't need it after 2 months.  I appreciate the opportunity to care for you. Gatha Mayer, MD, [facility]   **handout given on Ulcerative Colitis**  YOU HAD AN ENDOSCOPIC PROCEDURE TODAY: Refer to the procedure report and other information in the discharge instructions given to you for any specific questions about what was found during the examination. If this information does not answer your questions, please call [place] office at [phone] to clarify.   YOU SHOULD EXPECT: Some feelings of bloating in the abdomen. Passage of more gas than usual. Walking can help get rid of the air that was put into your GI tract during the procedure and reduce the bloating. If you had a lower endoscopy (such as a colonoscopy or flexible sigmoidoscopy) you may notice spotting of blood in your stool or on the toilet paper. Some abdominal soreness may be present for a day or two, also.  DIET: Your first meal following the procedure should be a light meal and then it is ok to progress to your normal diet. A half-sandwich or bowl of soup is an example of a good first meal. Heavy or fried foods are harder to digest and may make you feel nauseous or bloated. Drink plenty of fluids but you should avoid alcoholic beverages for 24 hours. If you had a esophageal dilation, please see attached instructions for diet.    ACTIVITY: Your care partner should take you home directly after the procedure. You should plan to take it easy, moving slowly for the rest of the day. You can resume normal activity the day after the procedure however YOU SHOULD NOT DRIVE, use power tools, machinery or perform tasks that involve climbing or major  physical exertion for 24 hours (because of the sedation medicines used during the test).   SYMPTOMS TO REPORT IMMEDIATELY: A gastroenterologist can be reached at any hour. Please call [phone]  for any of the following symptoms:  Following lower endoscopy (colonoscopy, flexible sigmoidoscopy) Excessive amounts of blood in the stool  Significant tenderness, worsening of abdominal pains  Swelling of the abdomen that is new, acute  Fever of 100 or higher     FOLLOW UP:  If any biopsies were taken you will be contacted by phone or by letter within the next 1-3 weeks. Call [phone]  if you have not heard about the biopsies in 3 weeks.  Please also call with any specific questions about appointments or follow up tests.

## 2017-09-20 ENCOUNTER — Telehealth: Payer: Self-pay

## 2017-09-20 ENCOUNTER — Telehealth: Payer: Self-pay | Admitting: *Deleted

## 2017-09-20 NOTE — Telephone Encounter (Signed)
Left message on f/u call 

## 2017-09-20 NOTE — Telephone Encounter (Signed)
  Follow up Call-  Call back number 09/19/2017  Post procedure Call Back phone  # [phone]  Permission to leave phone message Yes  Some recent data might be hidden     Patient questions:  Do you have a fever, pain , or abdominal swelling? No. Pain Score  0 *  Have you tolerated food without any problems? Yes.    Have you been able to return to your normal activities? Yes.    Do you have any questions about your discharge instructions: Diet   No. Medications  No. Follow up visit  No.  Do you have questions or concerns about your Care? No.  Actions: * If pain score is 4 or above: No action needed, pain <4.  No problems noted per pt. maw

## 2017-09-27 NOTE — Progress Notes (Signed)
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

## 2017-10-02 ENCOUNTER — Other Ambulatory Visit: Payer: Self-pay

## 2017-10-02 DIAGNOSIS — R195 Other fecal abnormalities: Secondary | ICD-10-CM

## 2017-10-02 DIAGNOSIS — K51819 Other ulcerative colitis with unspecified complications: Secondary | ICD-10-CM

## 2017-10-02 MED ORDER — MESALAMINE 1.2 G PO TBEC: 2.4000 g | DELAYED_RELEASE_TABLET | Freq: Every day | ORAL | 3 refills | Status: DC

## 2017-10-02 NOTE — Progress Notes (Signed)
Advised. Appointment scheduled. The Lialda Rx will have to be retransmitted. The original Rx was for 15 days instead of the 30 days needed.

## 2017-12-01 ENCOUNTER — Encounter: Payer: Self-pay | Admitting: Internal Medicine

## 2017-12-01 ENCOUNTER — Ambulatory Visit: Payer: BC Managed Care – PPO | Admitting: Internal Medicine

## 2017-12-01 VITALS — BP 140/80 | HR 64 | Ht 64.75 in | Wt 271.5 lb

## 2017-12-01 DIAGNOSIS — M150 Primary generalized (osteo)arthritis: Secondary | ICD-10-CM | POA: Diagnosis not present

## 2017-12-01 DIAGNOSIS — K51519 Left sided colitis with unspecified complications: Secondary | ICD-10-CM | POA: Diagnosis not present

## 2017-12-01 DIAGNOSIS — M159 Polyosteoarthritis, unspecified: Secondary | ICD-10-CM

## 2017-12-01 MED ORDER — MESALAMINE 1.2 G PO TBEC: 2.4000 g | DELAYED_RELEASE_TABLET | Freq: Two times a day (BID) | ORAL | 3 refills | Status: DC

## 2017-12-01 NOTE — Progress Notes (Addendum)
Joyce Kim Joyce Kim 57 y.o. October 05, 1961 [id number]  Assessment & Plan:   Encounter Diagnoses  Name Primary?  . Left sided colitis with complication ([place]) Yes  . Primary osteoarthritis involving multiple joints    1. UC- patient is improving, however doesn't appear to be in remission since she is still symptomatic. Will increase mesalamine dose to 2.4 g BID, pt to return for follow up in 2-3 mos. Provided education and counseling on IBD and gave her a membership to [employer].   2. OA- Pt advised to talk to Dr. Velvet Bathe about using Celebrex for OA since it is cox-2 and would be better for pts with UC and preventing flare-ups.   I have personally seen the patient, reviewed and repeated key elements of the history and physical and participated in formation of the assessment and plan the student has documented.  Joyce Mayer, MD, FACG  RK:YHCW, Ravisankar, MD  Subjective:   Chief Complaint: UC follow-up  HPI Pt is a 57 yo very nice F returning to clinic today for her follow-up on UC. She was newly diagnosed with UC after a colonoscopy in Nov 2018 and was started on Mesalamine 1.2 g 2 tabs QAM. Today she reports improvement in her symptoms. She is no longer having large bouts of watery diarrhea nor is she taking anti-diarrheal meds anymore. Also denies cramping, blood or mucous in stool, bloating, N/V, nightly bowel movements.   She is however still having soft stools, she is having atleast 1 BM per day but about 3 days a week she has 3-5 BMs per day. On these high frequency days, she has noted some incontinence/fecal seepage. Pt was concerned if she is immunosuppressed d/t UC being an autoimmune illness because she has been getting a lot more URIs this winter. Pt reports no side effects on mesalamine, she does have some joint pain however these are chronic issues.    Allergies  Allergen Reactions  . Sulfonamide Derivatives Shortness Of Breath and Rash  . Contrast Media [Iodinated  Diagnostic Agents]   . Doxycycline Nausea And Vomiting   Current Meds  Medication Sig  . aspirin 81 MG tablet Take 81 mg by mouth daily.  . cetirizine (ZYRTEC) 10 MG tablet Take 10 mg by mouth daily.  Marland Kitchen levothyroxine (SYNTHROID, LEVOTHROID) 50 MCG tablet Take 50 mcg by mouth daily before breakfast.  . meloxicam (MOBIC) 15 MG tablet Take 15 mg by mouth daily.   . Multiple Vitamin (MULTIVITAMIN) capsule Take 1 capsule by mouth daily.    . Nutritional Supplements (ESTROVEN PO) Take 1 tablet by mouth daily.  Marland Kitchen omeprazole (PRILOSEC) 40 MG capsule Take 40 mg by mouth daily.   . ranitidine (ZANTAC) 75 MG tablet Take 75 mg by mouth at bedtime.  . [DISCONTINUED] Loratadine (CLARITIN PO) Take by mouth.  . [DISCONTINUED] mesalamine (LIALDA) 1.2 g EC tablet Take 2 tablets (2.4 g total) daily with breakfast by mouth.   Past Medical History:  Diagnosis Date  . Allergy    seasonal  . Anemia   . Arthritis   . Barrett esophagus   . Blood transfusion abn reaction or complication, no procedure mishap 1968   after kidney surgery  . Blood transfusion without reported diagnosis   . DJD (degenerative joint disease)    fingers and ankle  . GERD (gastroesophageal reflux disease)   . H. pylori infection   . Heart murmur   . History of kidney stones   . Hypothyroidism   . Obesity   .  Onychomycosis   . Overactive bladder   . Plantar fasciitis   . RBBB   . Reflux   . Ulcer    Past Surgical History:  Procedure Laterality Date  . CHOLECYSTECTOMY  2002  . COLONOSCOPY    . ESOPHAGOGASTRODUODENOSCOPY (EGD) WITH PROPOFOL N/A 01/17/2017   Procedure: ESOPHAGOGASTRODUODENOSCOPY (EGD) WITH PROPOFOL;  Surgeon: Johnathan Hausen, MD;  Location: [facility];  Service: General;  Laterality: N/A;  . KIDNEY SURGERY  1966  . LAPAROSCOPIC GASTRIC BANDING  08/07/2007   repair hiatal hernia, Dr Hassell Done  . 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   Review of Systems Positive for frequent/soft stools, incontinence, joint pain. Negative for diarrhea, N, V, constipation, bloating, cramping, hematochezia, dysuria. Negative for all other systems.   Objective:   Physical Exam  @BP  (!) 156/90 (BP Location: Left Arm, Patient Position: Sitting, Cuff Size: Large)   Pulse 64   Ht 5' 4.75" (1.645 m)   Wt 271 lb 8 oz (123.2 kg)   BMI 45.53 kg/m @  General:  NAD Eyes:   anicteric Lungs:  clear Heart::  S1S2 no rubs, murmurs or gallops Abdomen:  soft and nontender, BS+   Data Reviewed: Colonoscopy report, meds, prior notes.   15 minutes time spent with patient > half in counseling coordination of care

## 2017-12-01 NOTE — Patient Instructions (Addendum)
  We have sent the following medications to your pharmacy for you to pick up at your convenience: Lialda, Dr Cecilie Kicks is increasing you dose.  Follow up with Dr Carlean Purl in April.    Talk to Dr Dagmar Hait about using celebrex.   We are giving you a complimentary [employer] membership form.   I appreciate the opportunity to care for you. Silvano Rusk, MD, [facility]

## 2019-01-14 ENCOUNTER — Other Ambulatory Visit: Payer: Self-pay | Admitting: Internal Medicine

## 2019-01-17 ENCOUNTER — Encounter: Payer: Self-pay | Admitting: Internal Medicine

## 2019-02-07 ENCOUNTER — Telehealth: Payer: Self-pay | Admitting: Internal Medicine

## 2019-02-07 NOTE — Telephone Encounter (Signed)
Pt has diarrhea 2-3 times a week . She has had diarrhea 5x today already. She was given mesalamine (LIALDA) and it was working until now and she is also running low on the med.  Would like some advise on what she should do.

## 2019-02-07 NOTE — Telephone Encounter (Signed)
Patient reports that she is having diarrhea and mesalamine has stopped working.  She will have a webex office visit tomorrow with Dr. Carlean Purl

## 2019-02-08 ENCOUNTER — Encounter: Payer: Self-pay | Admitting: Internal Medicine

## 2019-02-08 ENCOUNTER — Ambulatory Visit (INDEPENDENT_AMBULATORY_CARE_PROVIDER_SITE_OTHER): Payer: BC Managed Care – PPO | Admitting: Internal Medicine

## 2019-02-08 ENCOUNTER — Other Ambulatory Visit: Payer: Self-pay

## 2019-02-08 DIAGNOSIS — K5150 Left sided colitis without complications: Secondary | ICD-10-CM | POA: Diagnosis not present

## 2019-02-08 HISTORY — DX: Left sided colitis without complications: K51.50

## 2019-02-08 MED ORDER — PREDNISONE 10 MG PO TABS: 40.0000 mg | ORAL_TABLET | Freq: Every day | ORAL | 0 refills | Status: DC

## 2019-02-08 NOTE — Assessment & Plan Note (Signed)
Thinking this is flaring based upon sxs since Fall 2019.  Plan:  Prednisone 40 mg qd - side effects discussed Call me back in 3-4 days with an update and plans to taper Will need to refill Lialda soon but if fails to respond to prednisone may need infection w/u, other changes Will plan in person f/u whan able after Covid-19 restrictions gone or if clinically indicated sooner

## 2019-02-08 NOTE — Progress Notes (Signed)
TELEHEALTH ENCOUNTER IN SETTING OF COVID-19 PANDEMIC - REQUESTED BY PATIENT SERVICE PROVIDED BY TELEMEDECINE - TYPE: WebEx PATIENT LOCATION: Home PATIENT HAS CONSENTED TO TELEHEALTH VISIT PROVIDER LOCATION: OFFICE TIIME SPENT ON CALL/VISIT:15 minutes    Joyce Kim [name] 58 y.o. 1961-03-18 [id number]  Assessment & Plan:   Left sided ulcerative (chronic) colitis ([place]) Thinking this is flaring based upon sxs since Fall 2019.  Plan:  Prednisone 40 mg qd - side effects discussed Call me back in 3-4 days with an update and plans to taper Will need to refill Lialda soon but if fails to respond to prednisone may need infection w/u, other changes Will plan in person f/u whan able after Covid-19 restrictions gone or if clinically indicated sooner     Subjective:   Chief Complaint: diarrhea, colitis  HPI 58 yo ww with left UC dx 2018 - had done well on escalating dose of mesalamine 2.4g/day then 4.8 g/day. In October 2019 started with some episodic diarrhea that has progressed to more frequent and yesterday had 5 very loose and urgent stools. No bleeding, slight abdominal pain if any.     Allergies  Allergen Reactions  . Sulfonamide Derivatives Shortness Of Breath and Rash  . Contrast Media [Iodinated Diagnostic Agents]   . Doxycycline Nausea And Vomiting   Current Meds  Medication Sig  . aspirin 81 MG tablet Take 81 mg by mouth daily.  Marland Kitchen levothyroxine (SYNTHROID, LEVOTHROID) 50 MCG tablet Take 50 mcg by mouth daily before breakfast.  . mesalamine (LIALDA) 1.2 g EC tablet Take 2 tablets (2.4 g total) by mouth 2 (two) times daily with a meal.  . Multiple Vitamin (MULTIVITAMIN) capsule Take 1 capsule by mouth daily.    Marland Kitchen omeprazole (PRILOSEC) 40 MG capsule Take 40 mg by mouth daily.    Past Medical History:  Diagnosis Date  . Allergy    seasonal  . Anemia   . Arthritis   . Barrett esophagus   . Blood transfusion abn reaction or complication, no procedure mishap  1968   after kidney surgery  . Blood transfusion without reported diagnosis   . DJD (degenerative joint disease)    fingers and ankle  . GERD (gastroesophageal reflux disease)   . H. pylori infection   . Heart murmur   . History of kidney stones   . Hypothyroidism   . Obesity   . Onychomycosis   . Overactive bladder   . Plantar fasciitis   . RBBB   . Reflux   . Ulcer    Past Surgical History:  Procedure Laterality Date  . CHOLECYSTECTOMY  2002  . COLONOSCOPY    . ESOPHAGOGASTRODUODENOSCOPY (EGD) WITH PROPOFOL N/A 01/17/2017   Procedure: ESOPHAGOGASTRODUODENOSCOPY (EGD) WITH PROPOFOL;  Surgeon: Johnathan Hausen, MD;  Location: [facility];  Service: General;  Laterality: N/A;  . KIDNEY SURGERY  1966  . LAPAROSCOPIC GASTRIC BANDING  08/07/2007   repair hiatal hernia, Dr Hassell Done  . 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   Review of Systems As above No fever, no URI sxs

## 2019-02-14 ENCOUNTER — Telehealth: Payer: Self-pay | Admitting: Internal Medicine

## 2019-02-14 DIAGNOSIS — R197 Diarrhea, unspecified: Secondary | ICD-10-CM

## 2019-02-14 DIAGNOSIS — K5150 Left sided colitis without complications: Secondary | ICD-10-CM

## 2019-02-14 NOTE — Telephone Encounter (Signed)
Patient reports continued diarrhea multiple episodes a day despite the 40 mg of prednisone.  Discussed with Dr. Carlean Purl and she needs c-diff and GI pathogen panel labs.  Patient notified and will come to the lab to pick up the containers.  She is asked to remain on prednisone until results of stool specimens return,.

## 2019-02-15 ENCOUNTER — Other Ambulatory Visit: Payer: BC Managed Care – PPO

## 2019-02-19 ENCOUNTER — Other Ambulatory Visit: Payer: BC Managed Care – PPO

## 2019-02-19 DIAGNOSIS — K5150 Left sided colitis without complications: Secondary | ICD-10-CM

## 2019-02-19 DIAGNOSIS — R197 Diarrhea, unspecified: Secondary | ICD-10-CM

## 2019-02-20 NOTE — Progress Notes (Signed)
C diff is negative Please get an update on her diarrhea sxs and let me know

## 2019-02-22 LAB — GASTROINTESTINAL PATHOGEN PANEL PCR
C. difficile Tox A/B, PCR: NOT DETECTED
Campylobacter, PCR: NOT DETECTED
Cryptosporidium, PCR: NOT DETECTED
E coli (ETEC) LT/ST PCR: NOT DETECTED
E coli (STEC) stx1/stx2, PCR: NOT DETECTED
E coli 0157, PCR: NOT DETECTED
Giardia lamblia, PCR: NOT DETECTED
Norovirus, PCR: NOT DETECTED
Rotavirus A, PCR: NOT DETECTED
Salmonella, PCR: NOT DETECTED
Shigella, PCR: NOT DETECTED

## 2019-02-22 LAB — CLOSTRIDIUM DIFFICILE TOXIN B, QUALITATIVE, REAL-TIME PCR: Toxigenic C. Difficile by PCR: NOT DETECTED

## 2019-02-27 NOTE — Progress Notes (Signed)
Please clarify if she is still on prednisone - I know Imodium is helping but need to know about the prednisone  Let her know second check for infection is negative

## 2019-03-04 NOTE — Progress Notes (Signed)
Possible but doubt pancreatic insufficiency She has UC but this seems more like IBS  Take 30 mg daily prednisone x 1 week then go to 20 mg daily  Needs a visit (telehealth ok) in 1-2 weeks

## 2019-03-20 ENCOUNTER — Other Ambulatory Visit: Payer: Self-pay

## 2019-03-20 ENCOUNTER — Encounter: Payer: Self-pay | Admitting: Internal Medicine

## 2019-03-20 ENCOUNTER — Ambulatory Visit (INDEPENDENT_AMBULATORY_CARE_PROVIDER_SITE_OTHER): Payer: BC Managed Care – PPO | Admitting: Internal Medicine

## 2019-03-20 VITALS — Ht 66.5 in

## 2019-03-20 DIAGNOSIS — R197 Diarrhea, unspecified: Secondary | ICD-10-CM | POA: Diagnosis not present

## 2019-03-20 DIAGNOSIS — R1031 Right lower quadrant pain: Secondary | ICD-10-CM | POA: Diagnosis not present

## 2019-03-20 DIAGNOSIS — K5150 Left sided colitis without complications: Secondary | ICD-10-CM

## 2019-03-20 DIAGNOSIS — Z800 Family history of malignant neoplasm of digestive organs: Secondary | ICD-10-CM

## 2019-03-20 NOTE — Progress Notes (Addendum)
TELEHEALTH ENCOUNTER IN SETTING OF COVID-19 PANDEMIC - REQUESTED BY PATIENT SERVICE PROVIDED BY TELEMEDECINE - TYPE: Telephone, AV not possible PATIENT LOCATION: Home PATIENT HAS CONSENTED TO TELEHEALTH VISIT PROVIDER LOCATION: OFFICE REFERRING PROVIDER:Avva, Ravisankar, MD  PARTICIPANTS OTHER THAN PATIENT: None TIME SPENT ON CALL: 15 minutes    Joyce Kim 58 y.o. [birth year] [id number]  Assessment & Plan:   Encounter Diagnoses  Name Primary?  . Diarrhea, unspecified type Yes  . Left sided ulcerative (chronic) colitis ([place])   . RLQ abdominal pain   . Family history of pancreatic cancer - brother and 2 grandparents     Restart mesalamine for her ulcerative colitis.  Is not clear to me the prednisone helped that much.  I thought that if she was having a flare of her ulcerative colitis than the prednisone would have made a big difference.  She related to me this family history of pancreatic cancer today and given the overall situation I think she should have a CT of the abdomen and pelvis to try to clarify her signs and symptoms in the context of that family history.  We will schedule that. Note that if she takes a couple of Imodium and then another Imodium on a day with diarrhea she will move her bowels for a day or more.  In my mind that goes against this being an ulcerative colitis flare in addition to the lack of response to prednisone. CBC CMET C-reactive protein will be obtained.  Once I review the labs we can order the CT scan.  I appreciate the opportunity to care for this patient. CC: Avva, Ravisankar, MD   Subjective:   Chief Complaint: Diarrhea and abdominal pain  HPI Joyce Kim is having a telehealth visit to follow-up diarrhea and abdominal pain problems, we had talked on the phone a couple of weeks ago, she was having increasing diarrhea she has a history of left-sided ulcerative colitis and was on mesalamine.  Stool studies were negative i.e. GI pathogen  panel and C. difficile so an empiric trial of prednisone was tried for suspected ulcerative colitis flare but it helped a arthritic thumb but did not help her bowel habits at all and she is now off of that.  She is continuing with multiple softer loose bowel movements a day and is having some right lower quadrant discomfort.  She and her parents actually (their patients of mine) are concerned because her brother died of pancreatic cancer and there were 2 grandparents that also had a.  So she has a fairly strong history of pancreatic cancer in the family. Allergies  Allergen Reactions  . Sulfonamide Derivatives Shortness Of Breath and Rash  . Contrast Media [Iodinated Diagnostic Agents]   . Doxycycline Nausea And Vomiting   Current Meds  Medication Sig  . aspirin 81 MG tablet Take 81 mg by mouth daily.  . celecoxib (CELEBREX) 200 MG capsule Take 200 mg by mouth daily.  . cetirizine (ZYRTEC) 10 MG tablet Take 10 mg by mouth daily.  Marland Kitchen levothyroxine (SYNTHROID, LEVOTHROID) 50 MCG tablet Take 50 mcg by mouth daily before breakfast.  . Multiple Vitamin (MULTIVITAMIN) capsule Take 1 capsule by mouth daily.    Marland Kitchen omeprazole (PRILOSEC) 40 MG capsule Take 40 mg by mouth daily.    Past Medical History:  Diagnosis Date  . Allergy    seasonal  . Anemia   . Arthritis   . Barrett esophagus   . Blood transfusion abn reaction or complication, no procedure  mishap 1968   after kidney surgery  . Blood transfusion without reported diagnosis   . DJD (degenerative joint disease)    fingers and ankle  . GERD (gastroesophageal reflux disease)   . H. pylori infection   . Heart murmur   . History of kidney stones   . Hypothyroidism   . Left sided ulcerative (chronic) colitis ([place]) 02/08/2019  . Obesity   . Onychomycosis   . Overactive bladder   . Plantar fasciitis   . RBBB   . Reflux   . Ulcer    Past Surgical History:  Procedure Laterality Date  . CHOLECYSTECTOMY  2002  . COLONOSCOPY    .  ESOPHAGOGASTRODUODENOSCOPY (EGD) WITH PROPOFOL N/A 01/17/2017   Procedure: ESOPHAGOGASTRODUODENOSCOPY (EGD) WITH PROPOFOL;  Surgeon: Johnathan Hausen, MD;  Location: [facility];  Service: General;  Laterality: N/A;  . KIDNEY SURGERY  1966  . LAPAROSCOPIC GASTRIC BANDING  08/07/2007   repair hiatal hernia, Dr Hassell Done  . 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   Review of Systems As per HPI

## 2019-03-21 DIAGNOSIS — Z800 Family history of malignant neoplasm of digestive organs: Secondary | ICD-10-CM | POA: Insufficient documentation

## 2019-03-21 NOTE — Patient Instructions (Addendum)
As we discussed we will go ahead and get a CT scan of the abdomen and pelvis and restart your mesalamine.  I also want to obtain some labs.  You can come to the lab anytime between 8 and 4.  Once I see the results we can schedule the CT scan.  Gatha Mayer, MD, [facility]

## 2019-03-22 ENCOUNTER — Other Ambulatory Visit (INDEPENDENT_AMBULATORY_CARE_PROVIDER_SITE_OTHER): Payer: BC Managed Care – PPO

## 2019-03-22 DIAGNOSIS — K5150 Left sided colitis without complications: Secondary | ICD-10-CM | POA: Diagnosis not present

## 2019-03-22 DIAGNOSIS — R197 Diarrhea, unspecified: Secondary | ICD-10-CM

## 2019-03-22 LAB — COMPREHENSIVE METABOLIC PANEL
ALT: 19 U/L (ref 0–35)
AST: 19 U/L (ref 0–37)
Albumin: 4.1 g/dL (ref 3.5–5.2)
Alkaline Phosphatase: 95 U/L (ref 39–117)
BUN: 12 mg/dL (ref 6–23)
CO2: 27 mEq/L (ref 19–32)
Calcium: 9.6 mg/dL (ref 8.4–10.5)
Chloride: 101 mEq/L (ref 96–112)
Creatinine, Ser: 0.97 mg/dL (ref 0.40–1.20)
GFR: 59.14 mL/min — ABNORMAL LOW (ref >60.00)
Glucose, Bld: 96 mg/dL (ref 70–99)
Potassium: 4 mEq/L (ref 3.5–5.1)
Sodium: 138 mEq/L (ref 135–145)
Total Bilirubin: 1.2 mg/dL (ref 0.2–1.2)
Total Protein: 7.5 g/dL (ref 6.0–8.3)

## 2019-03-22 LAB — CBC WITH DIFFERENTIAL/PLATELET
Basophils Absolute: 0.1 10*3/uL (ref 0.0–0.1)
Basophils Relative: 1.3 % (ref 0.0–3.0)
Eosinophils Absolute: 0.2 10*3/uL (ref 0.0–0.7)
Eosinophils Relative: 1.9 % (ref 0.0–5.0)
HCT: 42.6 % (ref 36.0–46.0)
Hemoglobin: 14.3 g/dL (ref 12.0–15.0)
Lymphocytes Relative: 26 % (ref 12.0–46.0)
Lymphs Abs: 2.4 10*3/uL (ref 0.7–4.0)
MCHC: 33.6 g/dL (ref 30.0–36.0)
MCV: 86.3 fl (ref 78.0–100.0)
Monocytes Absolute: 0.6 10*3/uL (ref 0.1–1.0)
Monocytes Relative: 7.1 % (ref 3.0–12.0)
Neutro Abs: 5.8 10*3/uL (ref 1.4–7.7)
Neutrophils Relative %: 63.7 % (ref 43.0–77.0)
Platelets: 285 10*3/uL (ref 150.0–400.0)
RBC: 4.94 Mil/uL (ref 3.87–5.11)
RDW: 15.6 % — ABNORMAL HIGH (ref 11.5–15.5)
WBC: 9.1 10*3/uL (ref 4.0–10.5)

## 2019-03-22 LAB — C-REACTIVE PROTEIN: CRP: 2.9 mg/dL (ref 0.5–20.0)

## 2019-03-23 ENCOUNTER — Other Ambulatory Visit: Payer: Self-pay | Admitting: Internal Medicine

## 2019-03-25 ENCOUNTER — Telehealth: Payer: Self-pay

## 2019-03-25 DIAGNOSIS — K5150 Left sided colitis without complications: Secondary | ICD-10-CM

## 2019-03-25 DIAGNOSIS — Z800 Family history of malignant neoplasm of digestive organs: Secondary | ICD-10-CM

## 2019-03-25 DIAGNOSIS — R1031 Right lower quadrant pain: Secondary | ICD-10-CM

## 2019-03-25 MED ORDER — PREDNISONE 50 MG PO TABS: ORAL_TABLET | ORAL | 0 refills | Status: DC

## 2019-03-25 NOTE — Progress Notes (Signed)
Labs are ok  Schedule CT abd and pelvis with contrast  Dx RLQ pain Ulcerative colitis Family history of pancreatic cancer

## 2019-03-25 NOTE — Telephone Encounter (Signed)
I was getting ready to order the CT and noticed her allergy. I called her back and she said it gave her hives best she remembered. She has taken benadryl and done fine with other CT's . Just wanted to run this by you Sir before completing this order?

## 2019-03-25 NOTE — Telephone Encounter (Signed)
According to your office note from 03/20/2019 she was to restart her mesalamine. Do you recall if she had some on hand or do I need to refill. Let me know once you look at her labs if okay to proceed with her CT scan. Thank you Sir.

## 2019-03-25 NOTE — Telephone Encounter (Signed)
Patients CT scan is set up for 03/28/2019 at [facility] for 10:30AM. The pre-meds have been sent in and she has Benadryl at home. This makes her sleepy so she will have someone to take her to the CT appointment. She will pick up the oral contrast here tomorrow along with the instructions.

## 2019-03-25 NOTE — Telephone Encounter (Signed)
She needs the full prep and be done at the hospital  (prednisone and benadryl and whatever else the protocol says)

## 2019-03-26 NOTE — Telephone Encounter (Signed)
Dr Carlean Purl refilled her medicine.

## 2019-03-27 ENCOUNTER — Other Ambulatory Visit: Payer: Self-pay | Admitting: Internal Medicine

## 2019-03-27 DIAGNOSIS — R42 Dizziness and giddiness: Secondary | ICD-10-CM

## 2019-03-27 DIAGNOSIS — R2681 Unsteadiness on feet: Secondary | ICD-10-CM

## 2019-03-27 DIAGNOSIS — G4452 New daily persistent headache (NDPH): Secondary | ICD-10-CM

## 2019-03-27 DIAGNOSIS — R5381 Other malaise: Secondary | ICD-10-CM

## 2019-03-28 ENCOUNTER — Other Ambulatory Visit: Payer: Self-pay

## 2019-03-28 ENCOUNTER — Ambulatory Visit (HOSPITAL_COMMUNITY)
Admission: RE | Admit: 2019-03-28 | Discharge: 2019-03-28 | Disposition: A | Discharge status: Home / Self Care | Payer: BC Managed Care – PPO | Source: Ambulatory Visit | Attending: Internal Medicine | Admitting: Internal Medicine

## 2019-03-28 DIAGNOSIS — Z800 Family history of malignant neoplasm of digestive organs: Secondary | ICD-10-CM | POA: Insufficient documentation

## 2019-03-28 DIAGNOSIS — K5150 Left sided colitis without complications: Secondary | ICD-10-CM | POA: Diagnosis present

## 2019-03-28 DIAGNOSIS — R1031 Right lower quadrant pain: Secondary | ICD-10-CM | POA: Diagnosis present

## 2019-03-28 MED ORDER — IOHEXOL 300 MG/ML  SOLN
100.0000 mL | Freq: Once | INTRAMUSCULAR | Status: AC | PRN
  Administered 2019-03-28: 100 mL via INTRAVENOUS

## 2019-03-28 MED ORDER — SODIUM CHLORIDE (PF) 0.9 % IJ SOLN
INTRAMUSCULAR | Status: AC
  Filled 2019-03-28: qty 50

## 2019-04-01 NOTE — Progress Notes (Signed)
CT scan shows kidney stones and one in the right ureter which is likely cause of her pain  Her pancreas has some fatty replacement (not a problem) but no tumors  She needs to be seen by urology for the kidney stone problems  This week would be best I think unless symptoms gone then by next week  Follow-up me in July - sooner prn

## 2019-04-04 ENCOUNTER — Ambulatory Visit
Admission: RE | Admit: 2019-04-04 | Discharge: 2019-04-04 | Disposition: A | Discharge status: Home / Self Care | Payer: BC Managed Care – PPO | Source: Ambulatory Visit | Attending: Internal Medicine | Admitting: Internal Medicine

## 2019-04-04 ENCOUNTER — Other Ambulatory Visit: Payer: Self-pay

## 2019-04-04 DIAGNOSIS — R5381 Other malaise: Secondary | ICD-10-CM

## 2019-04-04 DIAGNOSIS — R42 Dizziness and giddiness: Secondary | ICD-10-CM

## 2019-04-04 DIAGNOSIS — R2681 Unsteadiness on feet: Secondary | ICD-10-CM

## 2019-04-04 DIAGNOSIS — G4452 New daily persistent headache (NDPH): Secondary | ICD-10-CM

## 2019-05-02 ENCOUNTER — Emergency Department (HOSPITAL_COMMUNITY): Payer: BC Managed Care – PPO

## 2019-05-02 ENCOUNTER — Other Ambulatory Visit: Payer: Self-pay

## 2019-05-02 ENCOUNTER — Inpatient Hospital Stay (HOSPITAL_COMMUNITY)
Admission: EM | Admit: 2019-05-02 | Discharge: 2019-05-08 | DRG: 854 | Disposition: A | Discharge status: Home / Self Care | Payer: BC Managed Care – PPO | Attending: Internal Medicine | Admitting: Internal Medicine

## 2019-05-02 ENCOUNTER — Encounter (HOSPITAL_COMMUNITY): Payer: Self-pay

## 2019-05-02 DIAGNOSIS — E039 Hypothyroidism, unspecified: Secondary | ICD-10-CM | POA: Diagnosis present

## 2019-05-02 DIAGNOSIS — R197 Diarrhea, unspecified: Secondary | ICD-10-CM | POA: Diagnosis not present

## 2019-05-02 DIAGNOSIS — R21 Rash and other nonspecific skin eruption: Secondary | ICD-10-CM | POA: Diagnosis not present

## 2019-05-02 DIAGNOSIS — N12 Tubulo-interstitial nephritis, not specified as acute or chronic: Secondary | ICD-10-CM

## 2019-05-02 DIAGNOSIS — N201 Calculus of ureter: Secondary | ICD-10-CM

## 2019-05-02 DIAGNOSIS — Z1159 Encounter for screening for other viral diseases: Secondary | ICD-10-CM | POA: Diagnosis not present

## 2019-05-02 DIAGNOSIS — N136 Pyonephrosis: Secondary | ICD-10-CM | POA: Diagnosis present

## 2019-05-02 DIAGNOSIS — K51918 Ulcerative colitis, unspecified with other complication: Secondary | ICD-10-CM | POA: Diagnosis not present

## 2019-05-02 DIAGNOSIS — R112 Nausea with vomiting, unspecified: Secondary | ICD-10-CM | POA: Diagnosis not present

## 2019-05-02 DIAGNOSIS — Z79899 Other long term (current) drug therapy: Secondary | ICD-10-CM | POA: Diagnosis not present

## 2019-05-02 DIAGNOSIS — A419 Sepsis, unspecified organism: Secondary | ICD-10-CM

## 2019-05-02 DIAGNOSIS — Z79890 Hormone replacement therapy (postmenopausal): Secondary | ICD-10-CM | POA: Diagnosis not present

## 2019-05-02 DIAGNOSIS — N390 Urinary tract infection, site not specified: Secondary | ICD-10-CM | POA: Diagnosis not present

## 2019-05-02 DIAGNOSIS — A0472 Enterocolitis due to Clostridium difficile, not specified as recurrent: Secondary | ICD-10-CM | POA: Diagnosis present

## 2019-05-02 DIAGNOSIS — K5190 Ulcerative colitis, unspecified, without complications: Secondary | ICD-10-CM | POA: Diagnosis present

## 2019-05-02 DIAGNOSIS — Z9049 Acquired absence of other specified parts of digestive tract: Secondary | ICD-10-CM

## 2019-05-02 DIAGNOSIS — Z7982 Long term (current) use of aspirin: Secondary | ICD-10-CM

## 2019-05-02 DIAGNOSIS — F329 Major depressive disorder, single episode, unspecified: Secondary | ICD-10-CM | POA: Diagnosis present

## 2019-05-02 DIAGNOSIS — Z6841 Body Mass Index (BMI) 40.0 and over, adult: Secondary | ICD-10-CM

## 2019-05-02 DIAGNOSIS — Z9884 Bariatric surgery status: Secondary | ICD-10-CM | POA: Diagnosis not present

## 2019-05-02 DIAGNOSIS — R7881 Bacteremia: Secondary | ICD-10-CM | POA: Diagnosis not present

## 2019-05-02 DIAGNOSIS — R1084 Generalized abdominal pain: Secondary | ICD-10-CM | POA: Diagnosis not present

## 2019-05-02 DIAGNOSIS — R509 Fever, unspecified: Secondary | ICD-10-CM | POA: Diagnosis present

## 2019-05-02 DIAGNOSIS — A4159 Other Gram-negative sepsis: Principal | ICD-10-CM | POA: Diagnosis present

## 2019-05-02 DIAGNOSIS — K219 Gastro-esophageal reflux disease without esophagitis: Secondary | ICD-10-CM | POA: Diagnosis present

## 2019-05-02 DIAGNOSIS — E876 Hypokalemia: Secondary | ICD-10-CM | POA: Diagnosis present

## 2019-05-02 DIAGNOSIS — Z419 Encounter for procedure for purposes other than remedying health state, unspecified: Secondary | ICD-10-CM

## 2019-05-02 HISTORY — DX: Tubulo-interstitial nephritis, not specified as acute or chronic: N12

## 2019-05-02 LAB — URINALYSIS, ROUTINE W REFLEX MICROSCOPIC
Bilirubin Urine: NEGATIVE
Glucose, UA: NEGATIVE mg/dL
Ketones, ur: NEGATIVE mg/dL
Nitrite: POSITIVE — AB
Protein, ur: 30 mg/dL — AB
Specific Gravity, Urine: 1.011 (ref 1.005–1.030)
WBC, UA: >50 WBC/hpf — ABNORMAL HIGH (ref 0–5)
pH: 6 (ref 5.0–8.0)

## 2019-05-02 LAB — CBC WITH DIFFERENTIAL/PLATELET
Abs Immature Granulocytes: 0.05 10*3/uL (ref 0.00–0.07)
Basophils Absolute: 0.1 10*3/uL (ref 0.0–0.1)
Basophils Relative: 1 %
Eosinophils Absolute: 0 10*3/uL (ref 0.0–0.5)
Eosinophils Relative: 0 %
HCT: 41.5 % (ref 36.0–46.0)
Hemoglobin: 13.3 g/dL (ref 12.0–15.0)
Immature Granulocytes: 0 %
Lymphocytes Relative: 12 %
Lymphs Abs: 1.4 10*3/uL (ref 0.7–4.0)
MCH: 28.7 pg (ref 26.0–34.0)
MCHC: 32 g/dL (ref 30.0–36.0)
MCV: 89.6 fL (ref 80.0–100.0)
Monocytes Absolute: 0.8 10*3/uL (ref 0.1–1.0)
Monocytes Relative: 7 %
Neutro Abs: 9.4 10*3/uL — ABNORMAL HIGH (ref 1.7–7.7)
Neutrophils Relative %: 80 %
Platelets: 312 10*3/uL (ref 150–400)
RBC: 4.63 MIL/uL (ref 3.87–5.11)
RDW: 14.5 % (ref 11.5–15.5)
WBC: 11.7 10*3/uL — ABNORMAL HIGH (ref 4.0–10.5)
nRBC: 0 % (ref 0.0–0.2)

## 2019-05-02 LAB — COMPREHENSIVE METABOLIC PANEL
ALT: 19 U/L (ref 0–44)
AST: 21 U/L (ref 15–41)
Albumin: 3.5 g/dL (ref 3.5–5.0)
Alkaline Phosphatase: 88 U/L (ref 38–126)
Anion gap: 12 (ref 5–15)
BUN: 6 mg/dL (ref 6–20)
CO2: 25 mmol/L (ref 22–32)
Calcium: 8.6 mg/dL — ABNORMAL LOW (ref 8.9–10.3)
Chloride: 104 mmol/L (ref 98–111)
Creatinine, Ser: 0.91 mg/dL (ref 0.44–1.00)
GFR calc Af Amer: >60 mL/min (ref >60)
GFR calc non Af Amer: >60 mL/min (ref >60)
Glucose, Bld: 134 mg/dL — ABNORMAL HIGH (ref 70–99)
Potassium: 3 mmol/L — ABNORMAL LOW (ref 3.5–5.1)
Sodium: 141 mmol/L (ref 135–145)
Total Bilirubin: 0.9 mg/dL (ref 0.3–1.2)
Total Protein: 6.9 g/dL (ref 6.5–8.1)

## 2019-05-02 LAB — SARS CORONAVIRUS 2 BY RT PCR (HOSPITAL ORDER, PERFORMED IN ~~LOC~~ HOSPITAL LAB): SARS Coronavirus 2: NEGATIVE

## 2019-05-02 LAB — MAGNESIUM: Magnesium: 1.8 mg/dL (ref 1.7–2.4)

## 2019-05-02 LAB — LACTIC ACID, PLASMA: Lactic Acid, Venous: 1.7 mmol/L (ref 0.5–1.9)

## 2019-05-02 MED ORDER — MAGNESIUM CITRATE PO SOLN: 1.0000 | Freq: Once | ORAL | Status: DC | PRN

## 2019-05-02 MED ORDER — ONDANSETRON HCL 4 MG PO TABS: 4.0000 mg | ORAL_TABLET | Freq: Four times a day (QID) | ORAL | Status: DC | PRN

## 2019-05-02 MED ORDER — SODIUM CHLORIDE 0.9 % IV SOLN
1.0000 g | Freq: Once | INTRAVENOUS | Status: AC
  Administered 2019-05-02: 1 g via INTRAVENOUS
  Filled 2019-05-02: qty 10

## 2019-05-02 MED ORDER — ALBUTEROL SULFATE (2.5 MG/3ML) 0.083% IN NEBU: 2.5000 mg | INHALATION_SOLUTION | Freq: Four times a day (QID) | RESPIRATORY_TRACT | Status: DC | PRN

## 2019-05-02 MED ORDER — OXYCODONE HCL 5 MG PO TABS: 5.0000 mg | ORAL_TABLET | ORAL | Status: DC | PRN

## 2019-05-02 MED ORDER — ONDANSETRON HCL 4 MG/2ML IJ SOLN
4.0000 mg | Freq: Once | INTRAMUSCULAR | Status: AC
  Administered 2019-05-02: 4 mg via INTRAVENOUS
  Filled 2019-05-02: qty 2

## 2019-05-02 MED ORDER — IPRATROPIUM BROMIDE 0.02 % IN SOLN: 0.5000 mg | Freq: Four times a day (QID) | RESPIRATORY_TRACT | Status: DC | PRN

## 2019-05-02 MED ORDER — ONDANSETRON HCL 4 MG/2ML IJ SOLN
4.0000 mg | Freq: Four times a day (QID) | INTRAMUSCULAR | Status: DC | PRN
  Administered 2019-05-03: 4 mg via INTRAVENOUS
  Filled 2019-05-02 (×2): qty 2

## 2019-05-02 MED ORDER — BISACODYL 5 MG PO TBEC: 5.0000 mg | DELAYED_RELEASE_TABLET | Freq: Every day | ORAL | Status: DC | PRN

## 2019-05-02 MED ORDER — ACETAMINOPHEN 500 MG PO TABS
1000.0000 mg | ORAL_TABLET | Freq: Once | ORAL | Status: AC
  Administered 2019-05-02: 1000 mg via ORAL
  Filled 2019-05-02: qty 2

## 2019-05-02 MED ORDER — ACETAMINOPHEN 325 MG PO TABS
650.0000 mg | ORAL_TABLET | Freq: Four times a day (QID) | ORAL | Status: DC | PRN
  Administered 2019-05-03 – 2019-05-05 (×2): 650 mg via ORAL
  Filled 2019-05-02 (×2): qty 2

## 2019-05-02 MED ORDER — SODIUM CHLORIDE 0.9 % IV SOLN
INTRAVENOUS | Status: DC
  Administered 2019-05-03: via INTRAVENOUS

## 2019-05-02 MED ORDER — SENNOSIDES-DOCUSATE SODIUM 8.6-50 MG PO TABS: 1.0000 | ORAL_TABLET | Freq: Every evening | ORAL | Status: DC | PRN

## 2019-05-02 MED ORDER — POTASSIUM CHLORIDE CRYS ER 20 MEQ PO TBCR
40.0000 meq | EXTENDED_RELEASE_TABLET | Freq: Once | ORAL | Status: AC
  Administered 2019-05-02: 40 meq via ORAL
  Filled 2019-05-02: qty 2

## 2019-05-02 MED ORDER — SODIUM CHLORIDE 0.9 % IV SOLN
1.0000 g | INTRAVENOUS | Status: DC
  Filled 2019-05-02: qty 10

## 2019-05-02 MED ORDER — ACETAMINOPHEN 650 MG RE SUPP: 650.0000 mg | Freq: Four times a day (QID) | RECTAL | Status: DC | PRN

## 2019-05-02 MED ORDER — LACTATED RINGERS IV BOLUS
1000.0000 mL | Freq: Once | INTRAVENOUS | Status: AC
  Administered 2019-05-02: 20:00:00 1000 mL via INTRAVENOUS

## 2019-05-02 NOTE — H&P (Signed)
History and Physical   [facility] [place] @ [place] Admission History and Physical [employer], D.O.    Patient Name: Joyce Kim MR#: [id number] Date of Birth: [birth year] Date of Admission: 05/02/2019  Referring MD/NP/PA: PA Langston Masker Primary Care Physician: Prince Solian, MD  Chief Complaint:  Chief Complaint  Patient presents with  . Fever  . Diarrhea  . Shortness of Breath  . Emesis  . Generalized Body Aches    HPI: Joyce Kim is a 58 y.o. female with a known history of ulcerative colitis, GERD, hypothyroidism presents to the emergency department for evaluation of multiple symptoms including fevers, chills, nausea, vomiting and diarrhea.  Patient was in a usual state of health until that 1 month ago when she reported worsening of her usual diarrhea which has recently developed into a crampy type of abdominal pain which is diffuse.  For the past 2-week she is experienced fever associated with nausea and vomiting.  Of note, 2 months ago, patient took one month of prednisone for a presumed UC flare.     Otherwise there has been no change in status. Patient has been taking medication as prescribed and there has been no recent change in medication or diet.  No recent antibiotics.  There has been no recent illness, hospitalizations, travel or sick contacts.    EMS/ED Course: Patient received Rocephin, potassium, Zofran and lactated Ringer's. Medical admission has been requested for further management of pyelonephritis.  Review of Systems:  CONSTITUTIONAL: Positive fever/chills, negative fatigue, weakness, weight gain/loss, headache. EYES: No blurry or double vision. ENT: No tinnitus, postnasal drip, redness or soreness of the oropharynx. RESPIRATORY: No cough, dyspnea, wheeze.  No hemoptysis.  CARDIOVASCULAR: No chest pain, palpitations, syncope, orthopnea. No lower extremity edema.  GASTROINTESTINAL: Positive nausea, vomiting,  abdominal pain, diarrhea, negative constipation.  No hematemesis, melena or hematochezia. GENITOURINARY: No dysuria, frequency, hematuria. ENDOCRINE: No polyuria or nocturia. No heat or cold intolerance. HEMATOLOGY: No anemia, bruising, bleeding. INTEGUMENTARY: No rashes, ulcers, lesions. MUSCULOSKELETAL: No arthritis, gout, dyspnea. NEUROLOGIC: No numbness, tingling, ataxia, seizure-type activity, weakness. PSYCHIATRIC: No anxiety, depression, insomnia.   Past Medical History:  Diagnosis Date  . Allergy    seasonal  . Anemia   . Arthritis   . Barrett esophagus   . Blood transfusion abn reaction or complication, no procedure mishap 1968   after kidney surgery  . Blood transfusion without reported diagnosis   . DJD (degenerative joint disease)    fingers and ankle  . GERD (gastroesophageal reflux disease)   . H. pylori infection   . Heart murmur   . History of kidney stones   . Hypothyroidism   . Left sided ulcerative (chronic) colitis ([place]) 02/08/2019  . Obesity   . Onychomycosis   . Overactive bladder   . Plantar fasciitis   . RBBB   . Reflux   . Ulcer     Past Surgical History:  Procedure Laterality Date  . CHOLECYSTECTOMY  2002  . COLONOSCOPY    . ESOPHAGOGASTRODUODENOSCOPY (EGD) WITH PROPOFOL N/A 01/17/2017   Procedure: ESOPHAGOGASTRODUODENOSCOPY (EGD) WITH PROPOFOL;  Surgeon: Johnathan Hausen, MD;  Location: [facility];  Service: General;  Laterality: N/A;  . KIDNEY SURGERY  1966  . LAPAROSCOPIC GASTRIC BANDING  08/07/2007   repair hiatal hernia, Dr Hassell Done  . UPPER GASTROINTESTINAL ENDOSCOPY       reports that she has never smoked. She has never used smokeless tobacco. She reports current alcohol use. She reports that she does  not use drugs.  Allergies  Allergen Reactions  . Sulfonamide Derivatives Shortness Of Breath and Rash  . Contrast Media [Iodinated Diagnostic Agents] Hives  . Doxycycline Nausea And Vomiting    Family History  Problem Relation Age of  Onset  . Melanoma Brother   . Pancreatic cancer Brother 80       in liver and stomach, ? pancreatic main source  . GI problems Mother        polyps, diverticulosis  . Peripheral vascular disease Mother   . Hyperlipidemia Mother   . Hiatal hernia Mother   . Coronary artery disease Mother   . Other Mother        DJD  . GER disease Father   . Hyperlipidemia Father   . Other Father        DJD  . Transient ischemic attack Father        ocular  . Pancreatic cancer Maternal Grandfather   . Pancreatic cancer Paternal Grandfather   . Breast cancer Other   . Breast cancer Other   . Colon cancer Neg Hx   . Esophageal cancer Neg Hx   . Esophageal varices Neg Hx     Prior to Admission medications   Medication Sig Start Date End Date Taking? Authorizing Provider  aspirin 81 MG tablet Take 81 mg by mouth daily.    [provider]  celecoxib (CELEBREX) 200 MG capsule Take 200 mg by mouth daily.    [provider]  cetirizine (ZYRTEC) 10 MG tablet Take 10 mg by mouth daily.    [provider]  levothyroxine (SYNTHROID, LEVOTHROID) 50 MCG tablet Take 50 mcg by mouth daily before breakfast.    [provider]  mesalamine (LIALDA) 1.2 g EC tablet TAKE 2 TABLETS BY MOUTH TWICE DAILY WITH A MEAL Patient taking differently: Take 2.4 g by mouth 2 (two) times daily with a meal.  03/25/19   Gatha Mayer, MD  Multiple Vitamin (MULTIVITAMIN) capsule Take 1 capsule by mouth daily.      [provider]  omeprazole (PRILOSEC) 40 MG capsule Take 40 mg by mouth daily.     [provider]  predniSONE (DELTASONE) 50 MG tablet Take one tablet 13 hours prior to CT, take one tablet 7 hours prior to CT, take one tablet one hour prior to CT . With last dose also take one 50 over the counter benadryl 03/25/19   Gatha Mayer, MD    Physical Exam: Vitals:   05/02/19 1727 05/02/19 1930 05/02/19 2000 05/02/19 2030  BP:  137/68 136/75 124/67  Pulse:   (!) 152  83  Resp:  (!) 23 (!) 23 (!) 26  Temp:      TempSrc:      SpO2:  (!) 88% 91% 91%  Weight: 122.9 kg     Height: 5' 6"  (1.676 m)       GENERAL: 57 y.o.-year-old female patient, well-developed, well-nourished lying in the bed in no acute distress.  Pleasant and cooperative.   HEENT: Head atraumatic, normocephalic. Pupils equal. Mucus membranes moist. NECK: Supple. No JVD. CHEST: Normal breath sounds bilaterally. No wheezing, rales, rhonchi or crackles. No use of accessory muscles of respiration.  No reproducible chest wall tenderness.  CARDIOVASCULAR: S1, S2 normal. No murmurs, rubs, or gallops.  ABDOMEN: Soft, nondistended, mild bilateral lower quadrant tenderness.  No rebound, guarding, rigidity. Normoactive bowel sounds present in all four quadrants.  EXTREMITIES: No pedal edema, cyanosis, or clubbing. No calf tenderness  or Homan's sign.  NEUROLOGIC: The patient is alert and oriented x 3. Cranial nerves II through XII are grossly intact with no focal sensorimotor deficit. PSYCHIATRIC:  Normal affect, mood, thought content. SKIN: Warm, dry, and intact without obvious rash, lesion, or ulcer.    Labs on Admission:  CBC: Recent Labs  Lab 05/02/19 1919  WBC 11.7*  NEUTROABS 9.4*  HGB 13.3  HCT 41.5  MCV 89.6  PLT 092   Basic Metabolic Panel: Recent Labs  Lab 05/02/19 1919  NA 141  K 3.0*  CL 104  CO2 25  GLUCOSE 134*  BUN 6  CREATININE 0.91  CALCIUM 8.6*  MG 1.8   GFR: Estimated Creatinine Clearance: 91.2 mL/min (by C-G formula based on SCr of 0.91 mg/dL). Liver Function Tests: Recent Labs  Lab 05/02/19 1919  AST 21  ALT 19  ALKPHOS 88  BILITOT 0.9  PROT 6.9  ALBUMIN 3.5   No results for input(s): LIPASE, AMYLASE in the last 168 hours. No results for input(s): AMMONIA in the last 168 hours. Coagulation Profile: No results for input(s): INR, PROTIME in the last 168 hours. Cardiac Enzymes: No results for input(s): CKTOTAL, CKMB, CKMBINDEX, TROPONINI in the  last 168 hours. BNP (last 3 results) No results for input(s): PROBNP in the last 8760 hours. HbA1C: No results for input(s): HGBA1C in the last 72 hours. CBG: No results for input(s): GLUCAP in the last 168 hours. Lipid Profile: No results for input(s): CHOL, HDL, LDLCALC, TRIG, CHOLHDL, LDLDIRECT in the last 72 hours. Thyroid Function Tests: No results for input(s): TSH, T4TOTAL, FREET4, T3FREE, THYROIDAB in the last 72 hours. Anemia Panel: No results for input(s): VITAMINB12, FOLATE, FERRITIN, TIBC, IRON, RETICCTPCT in the last 72 hours. Urine analysis:    Component Value Date/Time   COLORURINE YELLOW 05/02/2019 1814   APPEARANCEUR HAZY (A) 05/02/2019 1814   LABSPEC 1.011 05/02/2019 1814   PHURINE 6.0 05/02/2019 1814   GLUCOSEU NEGATIVE 05/02/2019 1814   HGBUR MODERATE (A) 05/02/2019 1814   BILIRUBINUR NEGATIVE 05/02/2019 1814   KETONESUR NEGATIVE 05/02/2019 1814   PROTEINUR 30 (A) 05/02/2019 1814   NITRITE POSITIVE (A) 05/02/2019 1814   LEUKOCYTESUR MODERATE (A) 05/02/2019 1814   Sepsis Labs: @LABRCNTIP (procalcitonin:4,lacticidven:4) ) Recent Results (from the past 240 hour(s))  SARS Coronavirus 2 (CEPHEID- Performed in [place] hospital lab), Hosp Order     Status: None   Collection Time: 05/02/19  6:36 PM   Specimen: Nasopharyngeal Swab  Result Value Ref Range Status   SARS Coronavirus 2 NEGATIVE NEGATIVE Final    Comment: (NOTE) If result is NEGATIVE SARS-CoV-2 target nucleic acids are NOT DETECTED. The SARS-CoV-2 RNA is generally detectable in upper and lower  respiratory specimens during the acute phase of infection. The lowest  concentration of SARS-CoV-2 viral copies this assay can detect is 250  copies / mL. A negative result does not preclude SARS-CoV-2 infection  and should not be used as the sole basis for treatment or other  patient management decisions.  A negative result may occur with  improper specimen collection / handling, submission of specimen  other  than nasopharyngeal swab, presence of viral mutation(s) within the  areas targeted by this assay, and inadequate number of viral copies  (<250 copies / mL). A negative result must be combined with clinical  observations, patient history, and epidemiological information. If result is POSITIVE SARS-CoV-2 target nucleic acids are DETECTED. The SARS-CoV-2 RNA is generally detectable in upper and lower  respiratory specimens dur ing the  acute phase of infection.  Positive  results are indicative of active infection with SARS-CoV-2.  Clinical  correlation with patient history and other diagnostic information is  necessary to determine patient infection status.  Positive results do  not rule out bacterial infection or co-infection with other viruses. If result is PRESUMPTIVE POSTIVE SARS-CoV-2 nucleic acids MAY BE PRESENT.   A presumptive positive result was obtained on the submitted specimen  and confirmed on repeat testing.  While 2019 novel coronavirus  (SARS-CoV-2) nucleic acids may be present in the submitted sample  additional confirmatory testing may be necessary for epidemiological  and / or clinical management purposes  to differentiate between  SARS-CoV-2 and other Sarbecovirus currently known to infect humans.  If clinically indicated additional testing with an alternate test  methodology [phone]) is advised. The SARS-CoV-2 RNA is generally  detectable in upper and lower respiratory sp ecimens during the acute  phase of infection. The expected result is Negative. Fact Sheet for Patients:  StrictlyIdeas.no Fact Sheet for Healthcare Providers: BankingDealers.co.za This test is not yet approved or cleared by the [place] FDA and has been authorized for detection and/or diagnosis of SARS-CoV-2 by FDA under an Emergency Use Authorization (EUA).  This EUA will remain in effect (meaning this test can be used) for the duration  of the COVID-19 declaration under Section 564(b)(1) of the Act, 21 U.S.C. section 360bbb-3(b)(1), unless the authorization is terminated or revoked sooner. Performed at [facility], [place] [address], [place], [place] [address]      Radiological Exams on Admission: Dg Abdomen 1 View  Result Date: 05/02/2019 CLINICAL DATA:  Diarrhea with abdominal bloating. EXAM: ABDOMEN - 1 VIEW COMPARISON:  CT scan 03/28/2019 FINDINGS: Supine view the abdomen shows no gaseous bowel dilatation to suggest obstruction. Lap band has a vertical orientation, stable since CT scan and slightly more vertical than on 8 KUB from 11/03/2016. Surgical clips right upper quadrant suggest prior cholecystectomy. Visualized bony anatomy unremarkable. IMPRESSION: No evidence for bowel obstruction. Vertical orientation of the lap band is similar to the recent CT scout image but appears to have become more vertical comparing to the KUB from 11/03/2016. Electronically Signed   By: Misty Stanley M.D.   On: 05/02/2019 20:50   Dg Chest Portable 1 View  Result Date: 05/02/2019 CLINICAL DATA:  Shortness of breath EXAM: PORTABLE CHEST 1 VIEW COMPARISON:  08/03/2007 FINDINGS: Mild cardiomegaly. No focal opacity or pleural effusion. No pneumothorax. IMPRESSION: No active disease.  Cardiomegaly Electronically Signed   By: Donavan Foil M.D.   On: 05/02/2019 19:18    Assessment/Plan  This is a 58 y.o. female with a history of ulcerative colitis, GERD, hypothyroidism  now being admitted with:  #. Pyelonephritis - Admit to inpatient - IV antibiotics: Rocephin - IV fluid hydration - Follow up urine cultures - Repeat CBC in am.   #. Mild hypokalemia - Replace orally - Check magnesium level  #.  Acute on chronic diarrhea secondary to ulcerative colitis -Check stool cultures including C. Difficile -Continue mesalamine and prednisone - Please call [facility] in AM - Add Flagyl  #. History of hypothyroidism -  Continue Synthroid  #. History of GERD - Continue Protonix for Prilosec  #. History of allergies - Continue Zyrtec  Admission status: Inpatient IV Fluids: Normal saline Diet/Nutrition: Heart healthy Consults called: None DVT Px: Lovenox, SCDs and early ambulation. Code Status: Full Code  Disposition Plan: To home in 1-2 days  All the records are reviewed  and case discussed with ED provider. Management plans discussed with the patient and/or family who express understanding and agree with plan of care.  Joyce Kim D.O. on 05/02/2019 at 9:53 PM CC: Primary care physician; Prince Solian, MD   05/02/2019, 9:53 PM

## 2019-05-02 NOTE — ED Notes (Signed)
Bed: XB93 Expected date:  Expected time:  Means of arrival:  Comments: Neg pressure

## 2019-05-02 NOTE — ED Provider Notes (Signed)
[facility] DEPT Provider Note   CSN: [id number] Arrival date & time: 05/02/19  1715     History   Chief Complaint Chief Complaint  Patient presents with  . Fever  . Diarrhea  . Shortness of Breath  . Emesis  . Generalized Body Aches    HPI Joyce Kim is a 58 y.o. female.     HPI   Patient is a 58 year old female with past medical history of ulcerative colitis on mesalamine, Barrett esophagus, hypothyroidism, arthritis, anemia presenting for fever, body aches, headache, nausea, vomiting, and diarrhea.  Patient reports she has had diarrhea over the course of approximately a month.  This initiated as a possible ulcerative colitis flare.  She has had intermittent blood streaks in her stool.  She denies any melanotic stool.  Patient reports that she has had intermittent fever for nearly 2 weeks now.  She did have 2-day reprieve from the fever earlier this week but then it began again yesterday.  T-max 102.  Patient reports that over the past couple days she has been too nauseous to take antipyretic.  Patient reports she began having a nonproductive cough today.  She does have some shortness of breath when she tries to move around and feels very fatigued.  Patient reports some crampy lower abdominal pain but no focal abdominal pain.  She denies dysuria, urgency, or frequency.  She denies visual disturbance, weakness or numbness in extremities, neck stiffness or rash.  Does not know anyone who has been diagnosed with COVID-19.  She has been going out to grocery stores and errands but no large crowds. No recent antibiotic use. She is followed by [facility].  Past Medical History:  Diagnosis Date  . Allergy    seasonal  . Anemia   . Arthritis   . Barrett esophagus   . Blood transfusion abn reaction or complication, no procedure mishap 1968   after kidney surgery  . Blood transfusion without reported diagnosis   . DJD  (degenerative joint disease)    fingers and ankle  . GERD (gastroesophageal reflux disease)   . H. pylori infection   . Heart murmur   . History of kidney stones   . Hypothyroidism   . Left sided ulcerative (chronic) colitis ([place]) 02/08/2019  . Obesity   . Onychomycosis   . Overactive bladder   . Plantar fasciitis   . RBBB   . Reflux   . Ulcer     Patient Active Problem List   Diagnosis Date Noted  . Family history of pancreatic cancer - brother and 2 grandparents 03/21/2019  . Left sided ulcerative (chronic) colitis ([place]) 02/08/2019  . 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 GERD 11/25/2009  . BARRETT'S ESOPHAGUS 11/25/2009    Past Surgical History:  Procedure Laterality Date  . CHOLECYSTECTOMY  2002  . COLONOSCOPY    . ESOPHAGOGASTRODUODENOSCOPY (EGD) WITH PROPOFOL N/A 01/17/2017   Procedure: ESOPHAGOGASTRODUODENOSCOPY (EGD) WITH PROPOFOL;  Surgeon: Johnathan Hausen, MD;  Location: [facility];  Service: General;  Laterality: N/A;  . KIDNEY SURGERY  1966  . LAPAROSCOPIC GASTRIC BANDING  08/07/2007   repair hiatal hernia, Dr Hassell Done  . UPPER GASTROINTESTINAL ENDOSCOPY       OB History   No obstetric history on file.      Home Medications    Prior to Admission medications   Medication Sig Start Date End Date Taking? Authorizing Provider  aspirin 81 MG tablet Take  81 mg by mouth daily.    [provider]  celecoxib (CELEBREX) 200 MG capsule Take 200 mg by mouth daily.    [provider]  cetirizine (ZYRTEC) 10 MG tablet Take 10 mg by mouth daily.    [provider]  levothyroxine (SYNTHROID, LEVOTHROID) 50 MCG tablet Take 50 mcg by mouth daily before breakfast.    [provider]  mesalamine (LIALDA) 1.2 g EC tablet TAKE 2 TABLETS BY MOUTH TWICE DAILY WITH A MEAL 03/25/19   Gatha Mayer, MD  Multiple Vitamin (MULTIVITAMIN) capsule Take 1 capsule by mouth daily.      [provider]  omeprazole  (PRILOSEC) 40 MG capsule Take 40 mg by mouth daily.     [provider]  predniSONE (DELTASONE) 50 MG tablet Take one tablet 13 hours prior to CT, take one tablet 7 hours prior to CT, take one tablet one hour prior to CT . With last dose also take one 50 over the counter benadryl 03/25/19   Gatha Mayer, MD    Family History Family History  Problem Relation Age of Onset  . Melanoma Brother   . Pancreatic cancer Brother 62       in liver and stomach, ? pancreatic main source  . GI problems Mother        polyps, diverticulosis  . Peripheral vascular disease Mother   . Hyperlipidemia Mother   . Hiatal hernia Mother   . Coronary artery disease Mother   . Other Mother        DJD  . GER disease Father   . Hyperlipidemia Father   . Other Father        DJD  . Transient ischemic attack Father        ocular  . Pancreatic cancer Maternal Grandfather   . Pancreatic cancer Paternal Grandfather   . Breast cancer Other   . Breast cancer Other   . Colon cancer Neg Hx   . Esophageal cancer Neg Hx   . Esophageal varices Neg Hx     Social History Social History   Tobacco Use  . Smoking status: Never Smoker  . Smokeless tobacco: Never Used  Substance Use Topics  . Alcohol use: Yes    Comment: 1 -2 drinks per year  . Drug use: No     Allergies   Sulfonamide derivatives, Contrast media [iodinated diagnostic agents], and Doxycycline   Review of Systems Review of Systems  Constitutional: Positive for chills, fatigue and fever.  HENT: Negative for congestion, rhinorrhea, sinus pain and sore throat.   Eyes: Negative for visual disturbance.  Respiratory: Positive for cough and shortness of breath. Negative for chest tightness.   Cardiovascular: Negative for chest pain, palpitations and leg swelling.  Gastrointestinal: Positive for diarrhea, nausea and vomiting. Negative for abdominal pain and constipation.  Genitourinary: Negative for dysuria and flank pain.   Musculoskeletal: Negative for back pain and myalgias.  Skin: Negative for rash.  Neurological: Positive for headaches. Negative for dizziness, syncope and light-headedness.     Physical Exam Updated Vital Signs BP 140/84 (BP Location: Right Arm)   Pulse 97   Temp (!) 101.6 F (38.7 C) (Oral)   Resp 18   Ht 5' 6"  (1.676 m)   Wt 122.9 kg   SpO2 95%   BMI 43.74 kg/m   Physical Exam Vitals signs and nursing note reviewed.  Constitutional:      General: She is not in acute distress.  Appearance: She is well-developed.  HENT:     Head: Normocephalic and atraumatic.     Mouth/Throat:     Mouth: Mucous membranes are dry.  Eyes:     Conjunctiva/sclera: Conjunctivae normal.     Pupils: Pupils are equal, round, and reactive to light.  Neck:     Musculoskeletal: Normal range of motion and neck supple. No neck rigidity.     Comments: No meningeal signs.  Cardiovascular:     Rate and Rhythm: Normal rate and regular rhythm.     Heart sounds: S1 normal and S2 normal. No murmur.  Pulmonary:     Effort: Pulmonary effort is normal.     Breath sounds: No wheezing or rales.     Comments: Breath sounds diminished.  Abdominal:     General: There is no distension.     Palpations: Abdomen is soft.     Tenderness: There is no guarding or rebound.     Comments: Very mild lower abdominal tenderness without focal RLQ or LLQ tenderness.   Musculoskeletal: Normal range of motion.        General: No deformity.  Lymphadenopathy:     Cervical: No cervical adenopathy.  Skin:    General: Skin is warm and dry.     Findings: No erythema or rash.  Neurological:     Mental Status: She is alert.     Comments: Cranial nerves grossly intact. Patient moves extremities symmetrically and with good coordination.  Psychiatric:        Behavior: Behavior normal.        Thought Content: Thought content normal.        Judgment: Judgment normal.      ED Treatments / Results  Labs (all labs ordered  are listed, but only abnormal results are displayed) Labs Reviewed  COMPREHENSIVE METABOLIC PANEL - Abnormal; Notable for the following components:      Result Value   Potassium 3.0 (*)    Glucose, Bld 134 (*)    Calcium 8.6 (*)    All other components within normal limits  CBC WITH DIFFERENTIAL/PLATELET - Abnormal; Notable for the following components:   WBC 11.7 (*)    Neutro Abs 9.4 (*)    All other components within normal limits  URINALYSIS, ROUTINE W REFLEX MICROSCOPIC - Abnormal; Notable for the following components:   APPearance HAZY (*)    Hgb urine dipstick MODERATE (*)    Protein, ur 30 (*)    Nitrite POSITIVE (*)    Leukocytes,Ua MODERATE (*)    WBC, UA >50 (*)    Bacteria, UA RARE (*)    All other components within normal limits  SARS CORONAVIRUS 2 (HOSPITAL ORDER, [facility])  CULTURE, BLOOD (ROUTINE X 2)  CULTURE, BLOOD (ROUTINE X 2)  GASTROINTESTINAL PANEL BY PCR, STOOL (REPLACES STOOL CULTURE)  C DIFFICILE QUICK SCREEN W PCR REFLEX  URINE CULTURE  LACTIC ACID, PLASMA  MAGNESIUM    EKG    ED ECG REPORT   Date: 05/02/2019  Rate: 78  Rhythm: normal sinus rhythm  QRS Axis: normal  Intervals: normal  ST/T Wave abnormalities: normal  Conduction Disutrbances:right bundle branch block  Narrative Interpretation:   Old EKG Reviewed: none available  I have personally reviewed the EKG tracing and agree with the computerized printout as noted.  Radiology Dg Abdomen 1 View  Result Date: 05/02/2019 CLINICAL DATA:  Diarrhea with abdominal bloating. EXAM: ABDOMEN - 1 VIEW COMPARISON:  CT scan 03/28/2019 FINDINGS:  Supine view the abdomen shows no gaseous bowel dilatation to suggest obstruction. Lap band has a vertical orientation, stable since CT scan and slightly more vertical than on 8 KUB from 11/03/2016. Surgical clips right upper quadrant suggest prior cholecystectomy. Visualized bony anatomy unremarkable. IMPRESSION: No evidence for  bowel obstruction. Vertical orientation of the lap band is similar to the recent CT scout image but appears to have become more vertical comparing to the KUB from 11/03/2016. Electronically Signed   By: Misty Stanley M.D.   On: 05/02/2019 20:50   Dg Chest Portable 1 View  Result Date: 05/02/2019 CLINICAL DATA:  Shortness of breath EXAM: PORTABLE CHEST 1 VIEW COMPARISON:  08/03/2007 FINDINGS: Mild cardiomegaly. No focal opacity or pleural effusion. No pneumothorax. IMPRESSION: No active disease.  Cardiomegaly Electronically Signed   By: Donavan Foil M.D.   On: 05/02/2019 19:18    Procedures Procedures (including critical care time)  Medications Ordered in ED Medications  cefTRIAXone (ROCEPHIN) 1 g in sodium chloride 0.9 % 100 mL IVPB (has no administration in time range)  lactated ringers bolus 1,000 mL (0 mLs Intravenous Stopped 05/02/19 2055)  ondansetron (ZOFRAN) injection 4 mg (4 mg Intravenous Given 05/02/19 2039)  potassium chloride SA (K-DUR) CR tablet 40 mEq (40 mEq Oral Given 05/02/19 2040)  acetaminophen (TYLENOL) tablet 1,000 mg (1,000 mg Oral Given 05/02/19 2040)     Initial Impression / Assessment and Plan / ED Course  I have reviewed the triage vital signs and the nursing notes.  Pertinent labs & imaging results that were available during my care of the patient were reviewed by me and considered in my medical decision making (see chart for details).  Clinical Course as of May 02 2155  Thu May 02, 2019  2017 SARS Coronavirus 2: NEGATIVE [AM]  2037 Anion gap: 12 [AM]  2136 Suggestive of UTI. Likely pyelo given fever and body aches.   Urinalysis, Routine w reflex microscopic(!) [AM]  2155 Spoke with Dr. Ara Kussmaul who will admit patient. Appreciate her involvement.    [AM]    Clinical Course User Index [AM] Albesa Seen, PA-C       Patient is nontoxic-appearing but febrile to 101.6. She is non-tachycardic, not tachypneic, not hypotensive and in no acute distress.   Differential diagnosis include SARS-CoV-2 infection, infectious or inflammatory diarrhea, urinary tract infection, other pulmonary infection, fever of unknown origin in an adult.  Do not suspect meningitis based on exam and history.  Clinically, she does appear dehydrated with dry mucous membranes.  Will he rehydrate patient while obtaining lab work.  Will hold off on antipyretics until patient has liver and renal function back.  Work-up demonstrating WBC count of 11.7.  Patient has hypokalemia 3.0 like a secondary to vomiting.  Magnesium is normal.  Lactic acid is normal.  Chest x-ray with cardiomegaly but without acute cardiopulmonary disease.  SARS-CoV-2 test is negative.  Urinalysis demonstrating moderate leukocytes, positive nitrites, greater than 50 WBCs.  Will culture urine.  Will admit for pyelonephritis.  Patient not meeting sepsis criteria during emergency department course.   Patient admitted to [facility] hospitalist.  Appreciate their involvement.  Final Clinical Impressions(s) / ED Diagnoses   Final diagnoses:  Pyelonephritis  Diarrhea, unspecified type  Hypokalemia    ED Discharge Orders    None       Tamala Julian 05/02/19 2302    Lacretia Leigh, MD 05/02/19 2303

## 2019-05-02 NOTE — ED Notes (Signed)
ED TO INPATIENT HANDOFF REPORT  Name/Age/Gender Joyce Kim 58 y.o. female  Code Status Advance Directive Documentation     Most Recent Value  Type of Advance Directive  Healthcare Power of Attorney, Living will  Pre-existing out of facility DNR order (yellow form or pink MOST form)  -  "MOST" Form in Place?  -      Home/SNF/Other Home  Chief Complaint Fever, Diarrhea, Abdominal pain, Headache, Emesis  Level of Care/Admitting Diagnosis ED Disposition    ED Disposition Condition [place]: [facility] [[id number]]  Level of Care: Med-Surg [16]  Covid Evaluation: Confirmed COVID Negative  Diagnosis: Pyelonephritis [161096]  Admitting Physician: Harvie Bridge [[id number]]  Attending Physician: Sherron Monday  Estimated length of stay: past midnight tomorrow  Certification:: I certify this patient will need inpatient services for at least 2 midnights  PT Class (Do Not Modify): Inpatient [101]  PT Acc Code (Do Not Modify): Private [1]       Medical History Past Medical History:  Diagnosis Date  . Allergy    seasonal  . Anemia   . Arthritis   . Barrett esophagus   . Blood transfusion abn reaction or complication, no procedure mishap 1968   after kidney surgery  . Blood transfusion without reported diagnosis   . DJD (degenerative joint disease)    fingers and ankle  . GERD (gastroesophageal reflux disease)   . H. pylori infection   . Heart murmur   . History of kidney stones   . Hypothyroidism   . Left sided ulcerative (chronic) colitis ([place]) 02/08/2019  . Obesity   . Onychomycosis   . Overactive bladder   . Plantar fasciitis   . RBBB   . Reflux   . Ulcer     Allergies Allergies  Allergen Reactions  . Sulfonamide Derivatives Shortness Of Breath and Rash  . Contrast Media [Iodinated Diagnostic Agents] Hives    Patient takes Benadryl when getting the diagnostic agents, seems to be fine  after.  . Doxycycline Nausea And Vomiting    IV Location/Drains/Wounds Patient Lines/Drains/Airways Status   Active Line/Drains/Airways    Name:   Placement date:   Placement time:   Site:   Days:   Peripheral IV 05/02/19 Left Antecubital   05/02/19    1905    Antecubital   less than 1          Labs/Imaging Results for orders placed or performed during the hospital encounter of 05/02/19 (from the past 48 hour(s))  Urinalysis, Routine w reflex microscopic     Status: Abnormal   Collection Time: 05/02/19  6:14 PM  Result Value Ref Range   Color, Urine YELLOW YELLOW   APPearance HAZY (A) CLEAR   Specific Gravity, Urine 1.011 1.005 - 1.030   pH 6.0 5.0 - 8.0   Glucose, UA NEGATIVE NEGATIVE mg/dL   Hgb urine dipstick MODERATE (A) NEGATIVE   Bilirubin Urine NEGATIVE NEGATIVE   Ketones, ur NEGATIVE NEGATIVE mg/dL   Protein, ur 30 (A) NEGATIVE mg/dL   Nitrite POSITIVE (A) NEGATIVE   Leukocytes,Ua MODERATE (A) NEGATIVE   RBC / HPF 0-5 0 - 5 RBC/hpf   WBC, UA >50 (H) 0 - 5 WBC/hpf   Bacteria, UA RARE (A) NONE SEEN   Squamous Epithelial / LPF 0-5 0 - 5   Mucus PRESENT     Comment: Performed at [facility], [place] [address], [place], [place] [address]  SARS  Coronavirus 2 (CEPHEID- Performed in [facility] hospital lab), Hosp Order     Status: None   Collection Time: 05/02/19  6:36 PM   Specimen: Nasopharyngeal Swab  Result Value Ref Range   SARS Coronavirus 2 NEGATIVE NEGATIVE    Comment: (NOTE) If result is NEGATIVE SARS-CoV-2 target nucleic acids are NOT DETECTED. The SARS-CoV-2 RNA is generally detectable in upper and lower  respiratory specimens during the acute phase of infection. The lowest  concentration of SARS-CoV-2 viral copies this assay can detect is 250  copies / mL. A negative result does not preclude SARS-CoV-2 infection  and should not be used as the sole basis for treatment or other  patient management decisions.  A negative result may occur  with  improper specimen collection / handling, submission of specimen other  than nasopharyngeal swab, presence of viral mutation(s) within the  areas targeted by this assay, and inadequate number of viral copies  (<250 copies / mL). A negative result must be combined with clinical  observations, patient history, and epidemiological information. If result is POSITIVE SARS-CoV-2 target nucleic acids are DETECTED. The SARS-CoV-2 RNA is generally detectable in upper and lower  respiratory specimens dur ing the acute phase of infection.  Positive  results are indicative of active infection with SARS-CoV-2.  Clinical  correlation with patient history and other diagnostic information is  necessary to determine patient infection status.  Positive results do  not rule out bacterial infection or co-infection with other viruses. If result is PRESUMPTIVE POSTIVE SARS-CoV-2 nucleic acids MAY BE PRESENT.   A presumptive positive result was obtained on the submitted specimen  and confirmed on repeat testing.  While 2019 novel coronavirus  (SARS-CoV-2) nucleic acids may be present in the submitted sample  additional confirmatory testing may be necessary for epidemiological  and / or clinical management purposes  to differentiate between  SARS-CoV-2 and other Sarbecovirus currently known to infect humans.  If clinically indicated additional testing with an alternate test  methodology [phone]) is advised. The SARS-CoV-2 RNA is generally  detectable in upper and lower respiratory sp ecimens during the acute  phase of infection. The expected result is Negative. Fact Sheet for Patients:  StrictlyIdeas.no Fact Sheet for Healthcare Providers: BankingDealers.co.za This test is not yet approved or cleared by the [place] FDA and has been authorized for detection and/or diagnosis of SARS-CoV-2 by FDA under an Emergency Use Authorization (EUA).  This EUA  will remain in effect (meaning this test can be used) for the duration of the COVID-19 declaration under Section 564(b)(1) of the Act, 21 U.S.C. section 360bbb-3(b)(1), unless the authorization is terminated or revoked sooner. Performed at [facility], [place] [address], [place], [place] [address]   Lactic acid, plasma     Status: None   Collection Time: 05/02/19  7:18 PM  Result Value Ref Range   Lactic Acid, Venous 1.7 0.5 - 1.9 mmol/L    Comment: Performed at [facility], [place] [address], [place], [place] [address]  Comprehensive metabolic panel     Status: Abnormal   Collection Time: 05/02/19  7:19 PM  Result Value Ref Range   Sodium 141 135 - 145 mmol/L   Potassium 3.0 (L) 3.5 - 5.1 mmol/L   Chloride 104 98 - 111 mmol/L   CO2 25 22 - 32 mmol/L   Glucose, Bld 134 (H) 70 - 99 mg/dL   BUN 6 6 - 20 mg/dL   Creatinine, Ser 0.91 0.44 - 1.00  mg/dL   Calcium 8.6 (L) 8.9 - 10.3 mg/dL   Total Protein 6.9 6.5 - 8.1 g/dL   Albumin 3.5 3.5 - 5.0 g/dL   AST 21 15 - 41 U/L   ALT 19 0 - 44 U/L   Alkaline Phosphatase 88 38 - 126 U/L   Total Bilirubin 0.9 0.3 - 1.2 mg/dL   GFR calc non Af Amer >60 >60 mL/min   GFR calc Af Amer >60 >60 mL/min   Anion gap 12 5 - 15    Comment: Performed at [facility], [place] [address] [place], [place] [address]  CBC with Differential     Status: Abnormal   Collection Time: 05/02/19  7:19 PM  Result Value Ref Range   WBC 11.7 (H) 4.0 - 10.5 K/uL   RBC 4.63 3.87 - 5.11 MIL/uL   Hemoglobin 13.3 12.0 - 15.0 g/dL   HCT 41.5 36.0 - 46.0 %   MCV 89.6 80.0 - 100.0 fL   MCH 28.7 26.0 - 34.0 pg   MCHC 32.0 30.0 - 36.0 g/dL   RDW 14.5 11.5 - 15.5 %   Platelets 312 150 - 400 K/uL   nRBC 0.0 0.0 - 0.2 %   Neutrophils Relative % 80 %   Neutro Abs 9.4 (H) 1.7 - 7.7 K/uL   Lymphocytes Relative 12 %   Lymphs Abs 1.4 0.7 - 4.0 K/uL   Monocytes Relative 7 %   Monocytes Absolute 0.8 0.1 - 1.0 K/uL    Eosinophils Relative 0 %   Eosinophils Absolute 0.0 0.0 - 0.5 K/uL   Basophils Relative 1 %   Basophils Absolute 0.1 0.0 - 0.1 K/uL   Immature Granulocytes 0 %   Abs Immature Granulocytes 0.05 0.00 - 0.07 K/uL    Comment: Performed at [facility], [place] [address], [place], [place] [address]  Magnesium     Status: None   Collection Time: 05/02/19  7:19 PM  Result Value Ref Range   Magnesium 1.8 1.7 - 2.4 mg/dL    Comment: Performed at [facility], [place] [address], [place], [place] [address]   Dg Abdomen 1 View  Result Date: 05/02/2019 CLINICAL DATA:  Diarrhea with abdominal bloating. EXAM: ABDOMEN - 1 VIEW COMPARISON:  CT scan 03/28/2019 FINDINGS: Supine view the abdomen shows no gaseous bowel dilatation to suggest obstruction. Lap band has a vertical orientation, stable since CT scan and slightly more vertical than on 8 KUB from 11/03/2016. Surgical clips right upper quadrant suggest prior cholecystectomy. Visualized bony anatomy unremarkable. IMPRESSION: No evidence for bowel obstruction. Vertical orientation of the lap band is similar to the recent CT scout image but appears to have become more vertical comparing to the KUB from 11/03/2016. Electronically Signed   By: Misty Stanley M.D.   On: 05/02/2019 20:50   Dg Chest Portable 1 View  Result Date: 05/02/2019 CLINICAL DATA:  Shortness of breath EXAM: PORTABLE CHEST 1 VIEW COMPARISON:  08/03/2007 FINDINGS: Mild cardiomegaly. No focal opacity or pleural effusion. No pneumothorax. IMPRESSION: No active disease.  Cardiomegaly Electronically Signed   By: Donavan Foil M.D.   On: 05/02/2019 19:18    Pending Labs Unresulted Labs (From admission, onward)    Start     Ordered   05/02/19 2143  Urine culture  ONCE - STAT,   STAT     05/02/19 2142   05/02/19 1910  C Difficile Quick Screen w PCR reflex  Once,   STAT  05/02/19 1910   05/02/19 1837  Gastrointestinal Panel by PCR , Stool  (Gastrointestinal  Panel by PCR, Stool)  Once,   STAT     05/02/19 1836   05/02/19 1836  Culture, blood (routine x 2)  BLOOD CULTURE X 2,   STAT     05/02/19 1836   Signed and Held  HIV antibody (Routine Testing)  Once,   R     Signed and Held   Signed and Held  Magnesium  Add-on,   R     Signed and Held   Signed and Held  Phosphorus  Add-on,   R     Signed and Held   Signed and Occupational hygienist morning,   R     Signed and Held   Signed and Held  CBC  Tomorrow morning,   R     Signed and Held   Signed and Held  C difficile quick scan w PCR reflex  (C Difficile quick screen w PCR reflex panel)  Once, for 24 hours,   R     Signed and Held          Vitals/Pain Today's Vitals   05/02/19 2000 05/02/19 2030 05/02/19 2206 05/02/19 2230  BP: 136/75 124/67 111/62 127/61  Pulse: (!) 152 83 72 75  Resp: (!) 23 (!) 26  20  Temp:      TempSrc:      SpO2: 91% 91% 92% 98%  Weight:      Height:      PainSc:        Isolation Precautions Enteric precautions (UV disinfection)  Medications Medications  lactated ringers bolus 1,000 mL (0 mLs Intravenous Stopped 05/02/19 2055)  ondansetron (ZOFRAN) injection 4 mg (4 mg Intravenous Given 05/02/19 2039)  potassium chloride SA (K-DUR) CR tablet 40 mEq (40 mEq Oral Given 05/02/19 2040)  acetaminophen (TYLENOL) tablet 1,000 mg (1,000 mg Oral Given 05/02/19 2040)  cefTRIAXone (ROCEPHIN) 1 g in sodium chloride 0.9 % 100 mL IVPB (0 g Intravenous Stopped 05/02/19 2236)    Mobility walks

## 2019-05-02 NOTE — ED Triage Notes (Signed)
Patient c/o fever, headache, SOB, emesis, diarrhea, generalized body pain, fatigue intermittently x 2 weeks.  Patient called her physician and was told to come to the ED for possible Covid-19  Symptoms.

## 2019-05-03 ENCOUNTER — Inpatient Hospital Stay (HOSPITAL_COMMUNITY): Payer: BC Managed Care – PPO

## 2019-05-03 ENCOUNTER — Encounter (HOSPITAL_COMMUNITY)
Admission: EM | Disposition: A | Discharge status: Home / Self Care | Payer: Self-pay | Source: Home / Self Care | Attending: Internal Medicine

## 2019-05-03 ENCOUNTER — Inpatient Hospital Stay (HOSPITAL_COMMUNITY): Payer: BC Managed Care – PPO | Admitting: Anesthesiology

## 2019-05-03 ENCOUNTER — Encounter (HOSPITAL_COMMUNITY): Payer: Self-pay | Admitting: *Deleted

## 2019-05-03 DIAGNOSIS — R112 Nausea with vomiting, unspecified: Secondary | ICD-10-CM

## 2019-05-03 DIAGNOSIS — R1084 Generalized abdominal pain: Secondary | ICD-10-CM

## 2019-05-03 HISTORY — PX: CYSTOSCOPY W/ URETERAL STENT PLACEMENT: SHX1429

## 2019-05-03 LAB — BLOOD CULTURE ID PANEL (REFLEXED)

## 2019-05-03 LAB — CBC
HCT: 38.8 % (ref 36.0–46.0)
Hemoglobin: 12.5 g/dL (ref 12.0–15.0)
MCH: 29.1 pg (ref 26.0–34.0)
MCHC: 32.2 g/dL (ref 30.0–36.0)
MCV: 90.2 fL (ref 80.0–100.0)
Platelets: 270 10*3/uL (ref 150–400)
RBC: 4.3 MIL/uL (ref 3.87–5.11)
RDW: 14.6 % (ref 11.5–15.5)
WBC: 15 10*3/uL — ABNORMAL HIGH (ref 4.0–10.5)
nRBC: 0 % (ref 0.0–0.2)

## 2019-05-03 LAB — BASIC METABOLIC PANEL
Anion gap: 11 (ref 5–15)
BUN: 9 mg/dL (ref 6–20)
CO2: 21 mmol/L — ABNORMAL LOW (ref 22–32)
Calcium: 8 mg/dL — ABNORMAL LOW (ref 8.9–10.3)
Chloride: 107 mmol/L (ref 98–111)
Creatinine, Ser: 0.89 mg/dL (ref 0.44–1.00)
GFR calc Af Amer: >60 mL/min (ref >60)
GFR calc non Af Amer: >60 mL/min (ref >60)
Glucose, Bld: 150 mg/dL — ABNORMAL HIGH (ref 70–99)
Potassium: 3.1 mmol/L — ABNORMAL LOW (ref 3.5–5.1)
Sodium: 139 mmol/L (ref 135–145)

## 2019-05-03 LAB — GASTROINTESTINAL PANEL BY PCR, STOOL (REPLACES STOOL CULTURE)

## 2019-05-03 LAB — MAGNESIUM: Magnesium: 1.9 mg/dL (ref 1.7–2.4)

## 2019-05-03 LAB — C DIFFICILE QUICK SCREEN W PCR REFLEX
C Diff antigen: POSITIVE — AB
C Diff toxin: NEGATIVE

## 2019-05-03 LAB — GLUCOSE, CAPILLARY: Glucose-Capillary: 126 mg/dL — ABNORMAL HIGH (ref 70–99)

## 2019-05-03 LAB — PHOSPHORUS: Phosphorus: 3.3 mg/dL (ref 2.5–4.6)

## 2019-05-03 SURGERY — CYSTOSCOPY, WITH RETROGRADE PYELOGRAM AND URETERAL STENT INSERTION
Anesthesia: General | Laterality: Right

## 2019-05-03 MED ORDER — PROPOFOL 10 MG/ML IV BOLUS
INTRAVENOUS | Status: AC
  Filled 2019-05-03: qty 20

## 2019-05-03 MED ORDER — LACTATED RINGERS IV SOLN
INTRAVENOUS | Status: DC
  Administered 2019-05-03 – 2019-05-05 (×6): via INTRAVENOUS

## 2019-05-03 MED ORDER — ENOXAPARIN SODIUM 40 MG/0.4ML ~~LOC~~ SOLN
40.0000 mg | SUBCUTANEOUS | Status: DC
  Administered 2019-05-03 – 2019-05-08 (×6): 40 mg via SUBCUTANEOUS
  Filled 2019-05-03 (×6): qty 0.4

## 2019-05-03 MED ORDER — FENTANYL CITRATE (PF) 100 MCG/2ML IJ SOLN
INTRAMUSCULAR | Status: DC | PRN
  Administered 2019-05-03: 50 ug via INTRAVENOUS
  Administered 2019-05-03 (×2): 25 ug via INTRAVENOUS

## 2019-05-03 MED ORDER — FENTANYL CITRATE (PF) 100 MCG/2ML IJ SOLN
INTRAMUSCULAR | Status: AC
  Filled 2019-05-03: qty 2

## 2019-05-03 MED ORDER — SUCCINYLCHOLINE CHLORIDE 200 MG/10ML IV SOSY
PREFILLED_SYRINGE | INTRAVENOUS | Status: DC | PRN
  Administered 2019-05-03: 120 mg via INTRAVENOUS

## 2019-05-03 MED ORDER — FENTANYL CITRATE (PF) 100 MCG/2ML IJ SOLN
50.0000 ug | Freq: Once | INTRAMUSCULAR | Status: AC
  Administered 2019-05-03: 50 ug via INTRAVENOUS
  Filled 2019-05-03: qty 2

## 2019-05-03 MED ORDER — PROMETHAZINE HCL 25 MG/ML IJ SOLN
12.5000 mg | Freq: Once | INTRAMUSCULAR | Status: AC
  Administered 2019-05-03: 12.5 mg via INTRAVENOUS
  Filled 2019-05-03: qty 1

## 2019-05-03 MED ORDER — MIDAZOLAM HCL 5 MG/5ML IJ SOLN
INTRAMUSCULAR | Status: DC | PRN
  Administered 2019-05-03: 2 mg via INTRAVENOUS

## 2019-05-03 MED ORDER — OXYCODONE-ACETAMINOPHEN 5-325 MG PO TABS
1.0000 | ORAL_TABLET | ORAL | Status: DC | PRN
  Administered 2019-05-04: 1 via ORAL
  Filled 2019-05-03: qty 1

## 2019-05-03 MED ORDER — MIDAZOLAM HCL 2 MG/2ML IJ SOLN
INTRAMUSCULAR | Status: AC
  Filled 2019-05-03: qty 2

## 2019-05-03 MED ORDER — ONDANSETRON HCL 4 MG/2ML IJ SOLN
INTRAMUSCULAR | Status: DC | PRN
  Administered 2019-05-03: 4 mg via INTRAVENOUS

## 2019-05-03 MED ORDER — POTASSIUM CHLORIDE 10 MEQ/100ML IV SOLN
10.0000 meq | INTRAVENOUS | Status: AC
  Administered 2019-05-03 (×4): 10 meq via INTRAVENOUS
  Filled 2019-05-03 (×4): qty 100

## 2019-05-03 MED ORDER — DEXAMETHASONE SODIUM PHOSPHATE 10 MG/ML IJ SOLN
INTRAMUSCULAR | Status: DC | PRN
  Administered 2019-05-03: 8 mg via INTRAVENOUS

## 2019-05-03 MED ORDER — POTASSIUM CHLORIDE CRYS ER 20 MEQ PO TBCR
40.0000 meq | EXTENDED_RELEASE_TABLET | Freq: Once | ORAL | Status: DC
  Filled 2019-05-03: qty 2

## 2019-05-03 MED ORDER — MORPHINE SULFATE (PF) 2 MG/ML IV SOLN
2.0000 mg | INTRAVENOUS | Status: DC | PRN
  Administered 2019-05-03: 2 mg via INTRAVENOUS
  Filled 2019-05-03: qty 1

## 2019-05-03 MED ORDER — DIPHENHYDRAMINE HCL 25 MG PO CAPS: 50.0000 mg | ORAL_CAPSULE | Freq: Once | ORAL | Status: DC

## 2019-05-03 MED ORDER — PROMETHAZINE HCL 25 MG/ML IJ SOLN: 6.2500 mg | INTRAMUSCULAR | Status: DC | PRN

## 2019-05-03 MED ORDER — IOHEXOL 300 MG/ML  SOLN
INTRAMUSCULAR | Status: DC | PRN
  Administered 2019-05-03: 19:00:00 10 mL via INTRAVENOUS

## 2019-05-03 MED ORDER — PROMETHAZINE HCL 25 MG/ML IJ SOLN
12.5000 mg | Freq: Four times a day (QID) | INTRAMUSCULAR | Status: DC | PRN
  Administered 2019-05-03 – 2019-05-06 (×4): 12.5 mg via INTRAVENOUS
  Filled 2019-05-03 (×4): qty 1

## 2019-05-03 MED ORDER — METRONIDAZOLE IN NACL 5-0.79 MG/ML-% IV SOLN
500.0000 mg | Freq: Three times a day (TID) | INTRAVENOUS | Status: DC
  Administered 2019-05-03 – 2019-05-04 (×5): 500 mg via INTRAVENOUS
  Filled 2019-05-03 (×5): qty 100

## 2019-05-03 MED ORDER — FENTANYL CITRATE (PF) 100 MCG/2ML IJ SOLN: 25.0000 ug | INTRAMUSCULAR | Status: DC | PRN

## 2019-05-03 MED ORDER — HYDROCORTISONE NA SUCCINATE PF 250 MG IJ SOLR: 200.0000 mg | Freq: Once | INTRAMUSCULAR | Status: DC

## 2019-05-03 MED ORDER — SODIUM CHLORIDE 0.9 % IV SOLN
2.0000 g | INTRAVENOUS | Status: DC
  Administered 2019-05-03 – 2019-05-07 (×5): 2 g via INTRAVENOUS
  Filled 2019-05-03 (×5): qty 2

## 2019-05-03 MED ORDER — SODIUM CHLORIDE 0.9 % IR SOLN
Status: DC | PRN
  Administered 2019-05-03: 3000 mL via INTRAVESICAL

## 2019-05-03 MED ORDER — DIPHENHYDRAMINE HCL 50 MG/ML IJ SOLN: 50.0000 mg | Freq: Once | INTRAMUSCULAR | Status: DC

## 2019-05-03 MED ORDER — DICYCLOMINE HCL 10 MG PO CAPS
10.0000 mg | ORAL_CAPSULE | Freq: Three times a day (TID) | ORAL | Status: DC
  Administered 2019-05-03 – 2019-05-08 (×19): 10 mg via ORAL
  Filled 2019-05-03 (×19): qty 1

## 2019-05-03 MED ORDER — PROPOFOL 10 MG/ML IV BOLUS
INTRAVENOUS | Status: DC | PRN
  Administered 2019-05-03: 180 mg via INTRAVENOUS

## 2019-05-03 MED ORDER — ACETAMINOPHEN 10 MG/ML IV SOLN
1000.0000 mg | Freq: Once | INTRAVENOUS | Status: AC
  Administered 2019-05-03: 1000 mg via INTRAVENOUS

## 2019-05-03 MED ORDER — ACETAMINOPHEN 10 MG/ML IV SOLN
INTRAVENOUS | Status: AC
  Filled 2019-05-03: qty 100

## 2019-05-03 SURGICAL SUPPLY — 23 items
BAG URO CATCHER STRL LF (MISCELLANEOUS) ×3 IMPLANT
CATH INTERMIT  6FR 70CM (CATHETERS) ×3 IMPLANT
CATH URET 5FR 28IN OPEN ENDED (CATHETERS) ×3 IMPLANT
CLOTH BEACON ORANGE TIMEOUT ST (SAFETY) ×3 IMPLANT
COVER WAND RF STERILE (DRAPES) IMPLANT
EXTRACTOR STONE NITINOL NGAGE (UROLOGICAL SUPPLIES) IMPLANT
FIBER LASER TRAC TIP (UROLOGICAL SUPPLIES) IMPLANT
GLOVE BIO SURGEON STRL SZ8 (GLOVE) ×3 IMPLANT
GOWN STRL REUS W/TWL XL LVL3 (GOWN DISPOSABLE) ×3 IMPLANT
GUIDEWIRE ANG ZIPWIRE 038X150 (WIRE) ×3 IMPLANT
GUIDEWIRE STR DUAL SENSOR (WIRE) ×3 IMPLANT
IV NS 1000ML (IV SOLUTION) ×2
IV NS 1000ML BAXH (IV SOLUTION) ×1 IMPLANT
KIT TURNOVER KIT A (KITS) IMPLANT
MANIFOLD NEPTUNE II (INSTRUMENTS) ×3 IMPLANT
PACK CYSTO (CUSTOM PROCEDURE TRAY) ×3 IMPLANT
SHEATH URETERAL 12FRX35CM (MISCELLANEOUS) IMPLANT
STENT CONTOUR 6FRX26X.038 (STENTS) ×3 IMPLANT
STENT URET 6FRX26 CONTOUR (STENTS) IMPLANT
TUBE FEEDING 8FR 16IN STR KANG (MISCELLANEOUS) IMPLANT
TUBING CONNECTING 10 (TUBING) ×2 IMPLANT
TUBING CONNECTING 10' (TUBING) ×1
TUBING UROLOGY SET (TUBING) IMPLANT

## 2019-05-03 NOTE — Transfer of Care (Signed)
Immediate Anesthesia Transfer of Care Note  Patient: [place]  Procedure(s) Performed: Procedure(s): CYSTOSCOPY WITH RETROGRADE PYELOGRAM/URETERAL STENT PLACEMENT (Right)  Patient Location: PACU  Anesthesia Type:General  Level of Consciousness:  sedated, patient cooperative and responds to stimulation  Airway & Oxygen Therapy:Patient Spontanous Breathing and Patient connected to face mask oxgen  Post-op Assessment:  Report given to PACU RN and Post -op Vital signs reviewed and stable  Post vital signs:  Reviewed and stable  Last Vitals:  Vitals:   05/03/19 1713 05/03/19 1836  BP: (!) 144/66 (!) 104/59  Pulse: 88 86  Resp: 16 20  Temp: (!) 39.2 C (!) 39.3 C  SpO2: 97% 58%    Complications: No apparent anesthesia complications

## 2019-05-03 NOTE — H&P (View-Only) (Signed)
Urology Consult  Referring physician: Dr. Posey Pronto Reason for referral: nephrolithiasis/pyelonephritis  Chief Complaint: right flank pain  History of Present Illness: Joyce Kim is a 58yo with a hx of nephrolithiasis who presented to the ER yesterday with abdominal pain, nausea/vomiting and fevers. She is followed by Dr. Karsten Ro for nephrolithiasis. She was diagnosed with a right ureteral calculus 1 month ago and was on medical expulsive therapy. She had been doing well and thought she had passed her calculus. Since admission she has had persistent nausea and vomiting. She has associated right flank pain that is sharp, constant, severe and nonraditing. No toher associated symptoms. No other exacerbating/alleviaiting events. She developed a fever of 101.6 today. CT scan from today shows a 52m right UPJ calculus with moderate hydronephrosis  Past Medical History:  Diagnosis Date  . Allergy    seasonal  . Anemia   . Arthritis   . Barrett esophagus   . Blood transfusion abn reaction or complication, no procedure mishap 1968   after kidney surgery  . Blood transfusion without reported diagnosis   . DJD (degenerative joint disease)    fingers and ankle  . Dyspnea    vomiting  . GERD (gastroesophageal reflux disease)   . H. pylori infection   . Heart murmur   . History of kidney stones   . Hypothyroidism   . 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 Obesity   . Onychomycosis   . Overactive bladder   . Plantar fasciitis   . RBBB   . Reflux   . Ulcer    Past Surgical History:  Procedure Laterality Date  . CHOLECYSTECTOMY  2002  . COLONOSCOPY    . ESOPHAGOGASTRODUODENOSCOPY (EGD) WITH PROPOFOL N/A 01/17/2017   Procedure: ESOPHAGOGASTRODUODENOSCOPY (EGD) WITH PROPOFOL;  Surgeon: MJohnathan Hausen MD;  Location: [facility];  Service: General;  Laterality: N/A;  . KIDNEY SURGERY  1966  . LAPAROSCOPIC GASTRIC BANDING  08/07/2007   repair hiatal hernia, Dr MHassell Done . UPPER  GASTROINTESTINAL ENDOSCOPY      Medications: I have reviewed the patient's current medications. Allergies:  Allergies  Allergen Reactions  . Sulfonamide Derivatives Shortness Of Breath and Rash  . Contrast Media [Iodinated Diagnostic Agents] Hives    Patient takes Benadryl when getting the diagnostic agents, seems to be fine after.  . Doxycycline Nausea And Vomiting    Family History  Problem Relation Age of Onset  . Melanoma Brother   . Pancreatic cancer Brother 59      in liver and stomach, ? pancreatic main source  . GI problems Mother        polyps, diverticulosis  . Peripheral vascular disease Mother   . Hyperlipidemia Mother   . Hiatal hernia Mother   . Coronary artery disease Mother   . Other Mother        DJD  . GER disease Father   . Hyperlipidemia Father   . Other Father        DJD  . Transient ischemic attack Father        ocular  . Pancreatic cancer Maternal Grandfather   . Pancreatic cancer Paternal Grandfather   . Breast cancer Other   . Breast cancer Other   . Colon cancer Neg Hx   . Esophageal cancer Neg Hx   . Esophageal varices Neg Hx    Social History:  reports that she has never smoked. She has never used smokeless tobacco. She reports current alcohol use. She reports that she  does not use drugs.  Review of Systems  Constitutional: Positive for chills and fever.  Gastrointestinal: Positive for nausea and vomiting.  Genitourinary: Positive for flank pain.  All other systems reviewed and are negative.   Physical Exam:  Vital signs in last 24 hours: Temp:  [98.4 F (36.9 C)-102.6 F (39.2 C)] 102.6 F (39.2 C) (06/19 1713) Pulse Rate:  [65-152] 88 (06/19 1713) Resp:  [16-26] 16 (06/19 1713) BP: (111-151)/(56-75) 144/66 (06/19 1713) SpO2:  [88 %-100 %] 91 % (06/19 1713) Physical Exam  Constitutional: She is oriented to person, place, and time. She appears well-developed and well-nourished.  HENT:  Head: Normocephalic and atraumatic.   Eyes: Pupils are equal, round, and reactive to light. EOM are normal.  Neck: Normal range of motion. No thyromegaly present.  Cardiovascular: Normal rate and regular rhythm.  Respiratory: Effort normal. No respiratory distress.  GI: Soft. She exhibits no distension.  Musculoskeletal: Normal range of motion.        General: No edema.  Neurological: She is alert and oriented to person, place, and time.  Skin: Skin is warm and dry.  Psychiatric: She has a normal mood and affect. Her behavior is normal. Judgment and thought content normal.    Laboratory Data:  Results for orders placed or performed during the hospital encounter of 05/02/19 (from the past 72 hour(s))  Urinalysis, Routine w reflex microscopic     Status: Abnormal   Collection Time: 05/02/19  6:14 PM  Result Value Ref Range   Color, Urine YELLOW YELLOW   APPearance HAZY (A) CLEAR   Specific Gravity, Urine 1.011 1.005 - 1.030   pH 6.0 5.0 - 8.0   Glucose, UA NEGATIVE NEGATIVE mg/dL   Hgb urine dipstick MODERATE (A) NEGATIVE   Bilirubin Urine NEGATIVE NEGATIVE   Ketones, ur NEGATIVE NEGATIVE mg/dL   Protein, ur 30 (A) NEGATIVE mg/dL   Nitrite POSITIVE (A) NEGATIVE   Leukocytes,Ua MODERATE (A) NEGATIVE   RBC / HPF 0-5 0 - 5 RBC/hpf   WBC, UA >50 (H) 0 - 5 WBC/hpf   Bacteria, UA RARE (A) NONE SEEN   Squamous Epithelial / LPF 0-5 0 - 5   Mucus PRESENT     Comment: Performed at [facility], [place] [address] [place], [place] [address]  SARS Coronavirus 2 (CEPHEID- Performed in [place] hospital lab), Hosp Order     Status: None   Collection Time: 05/02/19  6:36 PM   Specimen: Nasopharyngeal Swab  Result Value Ref Range   SARS Coronavirus 2 NEGATIVE NEGATIVE    Comment: (NOTE) If result is NEGATIVE SARS-CoV-2 target nucleic acids are NOT DETECTED. The SARS-CoV-2 RNA is generally detectable in upper and lower  respiratory specimens during the acute phase of infection. The lowest  concentration of  SARS-CoV-2 viral copies this assay can detect is 250  copies / mL. A negative result does not preclude SARS-CoV-2 infection  and should not be used as the sole basis for treatment or other  patient management decisions.  A negative result may occur with  improper specimen collection / handling, submission of specimen other  than nasopharyngeal swab, presence of viral mutation(s) within the  areas targeted by this assay, and inadequate number of viral copies  (<250 copies / mL). A negative result must be combined with clinical  observations, patient history, and epidemiological information. If result is POSITIVE SARS-CoV-2 target nucleic acids are DETECTED. The SARS-CoV-2 RNA is generally detectable in upper and lower  respiratory specimens  dur ing the acute phase of infection.  Positive  results are indicative of active infection with SARS-CoV-2.  Clinical  correlation with patient history and other diagnostic information is  necessary to determine patient infection status.  Positive results do  not rule out bacterial infection or co-infection with other viruses. If result is PRESUMPTIVE POSTIVE SARS-CoV-2 nucleic acids MAY BE PRESENT.   A presumptive positive result was obtained on the submitted specimen  and confirmed on repeat testing.  While 2019 novel coronavirus  (SARS-CoV-2) nucleic acids may be present in the submitted sample  additional confirmatory testing may be necessary for epidemiological  and / or clinical management purposes  to differentiate between  SARS-CoV-2 and other Sarbecovirus currently known to infect humans.  If clinically indicated additional testing with an alternate test  methodology [phone]) is advised. The SARS-CoV-2 RNA is generally  detectable in upper and lower respiratory sp ecimens during the acute  phase of infection. The expected result is Negative. Fact Sheet for Patients:  StrictlyIdeas.no Fact Sheet for Healthcare  Providers: BankingDealers.co.za This test is not yet approved or cleared by the [place] FDA and has been authorized for detection and/or diagnosis of SARS-CoV-2 by FDA under an Emergency Use Authorization (EUA).  This EUA will remain in effect (meaning this test can be used) for the duration of the COVID-19 declaration under Section 564(b)(1) of the Act, 21 U.S.C. section 360bbb-3(b)(1), unless the authorization is terminated or revoked sooner. Performed at [facility], [place] [address] [place], [place] [address]   Gastrointestinal Panel by PCR , Stool     Status: None   Collection Time: 05/02/19  6:37 PM   Specimen: Stool  Result Value Ref Range   Campylobacter species NOT DETECTED NOT DETECTED   Plesimonas shigelloides NOT DETECTED NOT DETECTED   Salmonella species NOT DETECTED NOT DETECTED   Yersinia enterocolitica NOT DETECTED NOT DETECTED   Vibrio species NOT DETECTED NOT DETECTED   Vibrio cholerae NOT DETECTED NOT DETECTED   Enteroaggregative E coli (EAEC) NOT DETECTED NOT DETECTED   Enteropathogenic E coli (EPEC) NOT DETECTED NOT DETECTED   Enterotoxigenic E coli (ETEC) NOT DETECTED NOT DETECTED   Shiga like toxin producing E coli (STEC) NOT DETECTED NOT DETECTED   Shigella/Enteroinvasive E coli (EIEC) NOT DETECTED NOT DETECTED   Cryptosporidium NOT DETECTED NOT DETECTED   Cyclospora cayetanensis NOT DETECTED NOT DETECTED   Entamoeba histolytica NOT DETECTED NOT DETECTED   Giardia lamblia NOT DETECTED NOT DETECTED   Adenovirus F40/41 NOT DETECTED NOT DETECTED   Astrovirus NOT DETECTED NOT DETECTED   Norovirus GI/GII NOT DETECTED NOT DETECTED   Rotavirus A NOT DETECTED NOT DETECTED   Sapovirus (I, II, IV, and V) NOT DETECTED NOT DETECTED    Comment: Performed at [facility], [address] [place], [place] [address]  Culture, blood (routine x 2)     Status: None (Preliminary result)   Collection Time: 05/02/19  6:58  PM   Specimen: BLOOD  Result Value Ref Range   Specimen Description      BLOOD LEFT ANTECUBITAL Performed at [facility], [place] [address] [place], [place] [address]    Special Requests      BOTTLES DRAWN AEROBIC AND ANAEROBIC Blood Culture adequate volume Performed at [place] [address] [place],  [address]    Culture  Setup Time      GRAM NEGATIVE RODS ANAEROBIC BOTTLE ONLY Organism ID to follow CRITICAL RESULT CALLED TO,  READ BACK BY AND VERIFIED WITH: Chales Abrahams PharmD 13:15 05/03/19 ([email or web address])    Culture      NO GROWTH < 12 HOURS Performed at [facility], [address] [place], [place] [address]    Report Status PENDING   Blood Culture ID Panel (Reflexed)     Status: Abnormal   Collection Time: 05/02/19  6:58 PM  Result Value Ref Range   Enterococcus species NOT DETECTED NOT DETECTED   Listeria monocytogenes NOT DETECTED NOT DETECTED   Staphylococcus species NOT DETECTED NOT DETECTED   Staphylococcus aureus (BCID) NOT DETECTED NOT DETECTED   Streptococcus species NOT DETECTED NOT DETECTED   Streptococcus agalactiae NOT DETECTED NOT DETECTED   Streptococcus pneumoniae NOT DETECTED NOT DETECTED   Streptococcus pyogenes NOT DETECTED NOT DETECTED   Acinetobacter baumannii NOT DETECTED NOT DETECTED   Enterobacteriaceae species DETECTED (A) NOT DETECTED    Comment: Enterobacteriaceae represent a large family of gram-negative bacteria, not a single organism. CRITICAL RESULT CALLED TO, READ BACK BY AND VERIFIED WITH: Chales Abrahams PharmD 13:15 05/03/19 ([email or web address])    Enterobacter cloacae complex NOT DETECTED NOT DETECTED   Escherichia coli NOT DETECTED NOT DETECTED   Klebsiella oxytoca NOT DETECTED NOT DETECTED   Klebsiella pneumoniae NOT DETECTED NOT DETECTED   Proteus species DETECTED (A) NOT DETECTED    Comment: CRITICAL RESULT CALLED TO, READ BACK BY AND VERIFIED WITH: Chales Abrahams PharmD 13:15 05/03/19 ([email or web address])    Serratia  marcescens NOT DETECTED NOT DETECTED   Carbapenem resistance NOT DETECTED NOT DETECTED   Haemophilus influenzae NOT DETECTED NOT DETECTED   Neisseria meningitidis NOT DETECTED NOT DETECTED   Pseudomonas aeruginosa NOT DETECTED NOT DETECTED   Candida albicans NOT DETECTED NOT DETECTED   Candida glabrata NOT DETECTED NOT DETECTED   Candida krusei NOT DETECTED NOT DETECTED   Candida parapsilosis NOT DETECTED NOT DETECTED   Candida tropicalis NOT DETECTED NOT DETECTED    Comment: Performed at [facility], [place] [address], [place], [place] [address]  Culture, blood (routine x 2)     Status: None (Preliminary result)   Collection Time: 05/02/19  7:05 PM   Specimen: BLOOD  Result Value Ref Range   Specimen Description      BLOOD RIGHT ANTECUBITAL Performed at [place] [address], [place], [place] [address]    Special Requests      BOTTLES DRAWN AEROBIC AND ANAEROBIC Blood Culture adequate volume Performed at [place] [address] [place], [place] [address]    Culture  Setup Time      GRAM NEGATIVE RODS ANAEROBIC BOTTLE ONLY CRITICAL VALUE NOTED.  VALUE IS CONSISTENT WITH PREVIOUSLY REPORTED AND CALLED VALUE.    Culture      NO GROWTH < 12 HOURS Performed at [place] [address], [place],  [address]    Report Status PENDING   Lactic acid, plasma     Status: None   Collection Time: 05/02/19  7:18 PM  Result Value Ref Range   Lactic Acid, Venous 1.7 0.5 - 1.9 mmol/L    Comment: Performed at [facility], [place] [address] [place],  [address]  Comprehensive metabolic panel     Status: Abnormal   Collection Time: 05/02/19  7:19 PM  Result Value Ref Range   Sodium 141 135 - 145 mmol/L   Potassium 3.0 (L) 3.5 - 5.1 mmol/L   Chloride 104 98 - 111 mmol/L   CO2 25  22 - 32 mmol/L   Glucose, Bld 134 (H) 70 - 99 mg/dL   BUN 6 6 - 20 mg/dL   Creatinine, Ser 0.91 0.44 - 1.00 mg/dL    Calcium 8.6 (L) 8.9 - 10.3 mg/dL   Total Protein 6.9 6.5 - 8.1 g/dL   Albumin 3.5 3.5 - 5.0 g/dL   AST 21 15 - 41 U/L   ALT 19 0 - 44 U/L   Alkaline Phosphatase 88 38 - 126 U/L   Total Bilirubin 0.9 0.3 - 1.2 mg/dL   GFR calc non Af Amer >60 >60 mL/min   GFR calc Af Amer >60 >60 mL/min   Anion gap 12 5 - 15    Comment: Performed at [facility], [place] [address], [place], [place] [address]  CBC with Differential     Status: Abnormal   Collection Time: 05/02/19  7:19 PM  Result Value Ref Range   WBC 11.7 (H) 4.0 - 10.5 K/uL   RBC 4.63 3.87 - 5.11 MIL/uL   Hemoglobin 13.3 12.0 - 15.0 g/dL   HCT 41.5 36.0 - 46.0 %   MCV 89.6 80.0 - 100.0 fL   MCH 28.7 26.0 - 34.0 pg   MCHC 32.0 30.0 - 36.0 g/dL   RDW 14.5 11.5 - 15.5 %   Platelets 312 150 - 400 K/uL   nRBC 0.0 0.0 - 0.2 %   Neutrophils Relative % 80 %   Neutro Abs 9.4 (H) 1.7 - 7.7 K/uL   Lymphocytes Relative 12 %   Lymphs Abs 1.4 0.7 - 4.0 K/uL   Monocytes Relative 7 %   Monocytes Absolute 0.8 0.1 - 1.0 K/uL   Eosinophils Relative 0 %   Eosinophils Absolute 0.0 0.0 - 0.5 K/uL   Basophils Relative 1 %   Basophils Absolute 0.1 0.0 - 0.1 K/uL   Immature Granulocytes 0 %   Abs Immature Granulocytes 0.05 0.00 - 0.07 K/uL    Comment: Performed at [facility], [place] [address] [place], [place] [address]  Magnesium     Status: None   Collection Time: 05/02/19  7:19 PM  Result Value Ref Range   Magnesium 1.8 1.7 - 2.4 mg/dL    Comment: Performed at [facility], [place] [address], [place], [place] [address]  C difficile quick scan w PCR reflex     Status: Abnormal   Collection Time: 05/02/19 11:42 PM   Specimen: STOOL  Result Value Ref Range   C Diff antigen POSITIVE (A) NEGATIVE   C Diff toxin NEGATIVE NEGATIVE   C Diff interpretation Results are indeterminate. See PCR results.     Comment: Performed at [facility], [place] [address] [place], [place]  [address]  Magnesium     Status: None   Collection Time: 05/03/19 12:29 AM  Result Value Ref Range   Magnesium 1.9 1.7 - 2.4 mg/dL    Comment: Performed at [facility], [place] [address] [place], [place] [address]  Phosphorus     Status: None   Collection Time: 05/03/19 12:29 AM  Result Value Ref Range   Phosphorus 3.3 2.5 - 4.6 mg/dL    Comment: Performed at [facility], [place] [address], [place], [place] [address]  Basic metabolic panel     Status: Abnormal   Collection Time: 05/03/19  3:36 AM  Result Value Ref Range   Sodium 139 135 - 145 mmol/L   Potassium 3.1 (L) 3.5 - 5.1  mmol/L   Chloride 107 98 - 111 mmol/L   CO2 21 (L) 22 - 32 mmol/L   Glucose, Bld 150 (H) 70 - 99 mg/dL   BUN 9 6 - 20 mg/dL   Creatinine, Ser 0.89 0.44 - 1.00 mg/dL   Calcium 8.0 (L) 8.9 - 10.3 mg/dL   GFR calc non Af Amer >60 >60 mL/min   GFR calc Af Amer >60 >60 mL/min   Anion gap 11 5 - 15    Comment: Performed at [facility], [place] [address], [place], [place] [address]  CBC     Status: Abnormal   Collection Time: 05/03/19  3:36 AM  Result Value Ref Range   WBC 15.0 (H) 4.0 - 10.5 K/uL   RBC 4.30 3.87 - 5.11 MIL/uL   Hemoglobin 12.5 12.0 - 15.0 g/dL   HCT 38.8 36.0 - 46.0 %   MCV 90.2 80.0 - 100.0 fL   MCH 29.1 26.0 - 34.0 pg   MCHC 32.2 30.0 - 36.0 g/dL   RDW 14.6 11.5 - 15.5 %   Platelets 270 150 - 400 K/uL   nRBC 0.0 0.0 - 0.2 %    Comment: Performed at [facility], [place] [address] [place], [place] [address]  Glucose, capillary     Status: Abnormal   Collection Time: 05/03/19 10:18 AM  Result Value Ref Range   Glucose-Capillary 126 (H) 70 - 99 mg/dL   Recent Results (from the past 240 hour(s))  SARS Coronavirus 2 (CEPHEID- Performed in [place] hospital lab), Hosp Order     Status: None   Collection Time: 05/02/19  6:36 PM   Specimen: Nasopharyngeal Swab  Result Value Ref Range Status   SARS Coronavirus 2 NEGATIVE  NEGATIVE Final    Comment: (NOTE) If result is NEGATIVE SARS-CoV-2 target nucleic acids are NOT DETECTED. The SARS-CoV-2 RNA is generally detectable in upper and lower  respiratory specimens during the acute phase of infection. The lowest  concentration of SARS-CoV-2 viral copies this assay can detect is 250  copies / mL. A negative result does not preclude SARS-CoV-2 infection  and should not be used as the sole basis for treatment or other  patient management decisions.  A negative result may occur with  improper specimen collection / handling, submission of specimen other  than nasopharyngeal swab, presence of viral mutation(s) within the  areas targeted by this assay, and inadequate number of viral copies  (<250 copies / mL). A negative result must be combined with clinical  observations, patient history, and epidemiological information. If result is POSITIVE SARS-CoV-2 target nucleic acids are DETECTED. The SARS-CoV-2 RNA is generally detectable in upper and lower  respiratory specimens dur ing the acute phase of infection.  Positive  results are indicative of active infection with SARS-CoV-2.  Clinical  correlation with patient history and other diagnostic information is  necessary to determine patient infection status.  Positive results do  not rule out bacterial infection or co-infection with other viruses. If result is PRESUMPTIVE POSTIVE SARS-CoV-2 nucleic acids MAY BE PRESENT.   A presumptive positive result was obtained on the submitted specimen  and confirmed on repeat testing.  While 2019 novel coronavirus  (SARS-CoV-2) nucleic acids may be present in the submitted sample  additional confirmatory testing may be necessary for epidemiological  and / or clinical management purposes  to differentiate between  SARS-CoV-2 and other Sarbecovirus currently known to infect humans.  If clinically indicated additional testing with an alternate  test  methodology [phone]) is  advised. The SARS-CoV-2 RNA is generally  detectable in upper and lower respiratory sp ecimens during the acute  phase of infection. The expected result is Negative. Fact Sheet for Patients:  StrictlyIdeas.no Fact Sheet for Healthcare Providers: BankingDealers.co.za This test is not yet approved or cleared by the [place] FDA and has been authorized for detection and/or diagnosis of SARS-CoV-2 by FDA under an Emergency Use Authorization (EUA).  This EUA will remain in effect (meaning this test can be used) for the duration of the COVID-19 declaration under Section 564(b)(1) of the Act, 21 U.S.C. section 360bbb-3(b)(1), unless the authorization is terminated or revoked sooner. Performed at [facility], [place] [address] [place], [place] [address]   Gastrointestinal Panel by PCR , Stool     Status: None   Collection Time: 05/02/19  6:37 PM   Specimen: Stool  Result Value Ref Range Status   Campylobacter species NOT DETECTED NOT DETECTED Final   Plesimonas shigelloides NOT DETECTED NOT DETECTED Final   Salmonella species NOT DETECTED NOT DETECTED Final   Yersinia enterocolitica NOT DETECTED NOT DETECTED Final   Vibrio species NOT DETECTED NOT DETECTED Final   Vibrio cholerae NOT DETECTED NOT DETECTED Final   Enteroaggregative E coli (EAEC) NOT DETECTED NOT DETECTED Final   Enteropathogenic E coli (EPEC) NOT DETECTED NOT DETECTED Final   Enterotoxigenic E coli (ETEC) NOT DETECTED NOT DETECTED Final   Shiga like toxin producing E coli (STEC) NOT DETECTED NOT DETECTED Final   Shigella/Enteroinvasive E coli (EIEC) NOT DETECTED NOT DETECTED Final   Cryptosporidium NOT DETECTED NOT DETECTED Final   Cyclospora cayetanensis NOT DETECTED NOT DETECTED Final   Entamoeba histolytica NOT DETECTED NOT DETECTED Final   Giardia lamblia NOT DETECTED NOT DETECTED Final   Adenovirus F40/41 NOT DETECTED NOT DETECTED Final    Astrovirus NOT DETECTED NOT DETECTED Final   Norovirus GI/GII NOT DETECTED NOT DETECTED Final   Rotavirus A NOT DETECTED NOT DETECTED Final   Sapovirus (I, II, IV, and V) NOT DETECTED NOT DETECTED Final    Comment: Performed at [facility], [place] [place], [place] [address]  Culture, blood (routine x 2)     Status: None (Preliminary result)   Collection Time: 05/02/19  6:58 PM   Specimen: BLOOD  Result Value Ref Range Status   Specimen Description   Final    BLOOD LEFT ANTECUBITAL Performed at [facility], [place] [address], [place], [place] [address]    Special Requests   Final    BOTTLES DRAWN AEROBIC AND ANAEROBIC Blood Culture adequate volume Performed at [place] [address] [place], [place] [address]    Culture  Setup Time   Final    GRAM NEGATIVE RODS ANAEROBIC BOTTLE ONLY Organism ID to follow CRITICAL RESULT CALLED TO, READ BACK BY AND VERIFIED WITH: Chales Abrahams PharmD 13:15 05/03/19 ([email or web address])    Culture   Final    NO GROWTH < 12 HOURS Performed at [facility], [address], [place], [place] [address]    Report Status PENDING  Incomplete  Blood Culture ID Panel (Reflexed)     Status: Abnormal   Collection Time: 05/02/19  6:58 PM  Result Value Ref Range Status   Enterococcus species NOT DETECTED NOT DETECTED Final   Listeria monocytogenes NOT DETECTED NOT DETECTED Final   Staphylococcus species NOT DETECTED NOT DETECTED Final   Staphylococcus aureus (BCID) NOT DETECTED NOT DETECTED Final  Streptococcus species NOT DETECTED NOT DETECTED Final   Streptococcus agalactiae NOT DETECTED NOT DETECTED Final   Streptococcus pneumoniae NOT DETECTED NOT DETECTED Final   Streptococcus pyogenes NOT DETECTED NOT DETECTED Final   Acinetobacter baumannii NOT DETECTED NOT DETECTED Final   Enterobacteriaceae species DETECTED (A) NOT DETECTED Final    Comment: Enterobacteriaceae represent a large family of  gram-negative bacteria, not a single organism. CRITICAL RESULT CALLED TO, READ BACK BY AND VERIFIED WITH: Chales Abrahams PharmD 13:15 05/03/19 ([email or web address])    Enterobacter cloacae complex NOT DETECTED NOT DETECTED Final   Escherichia coli NOT DETECTED NOT DETECTED Final   Klebsiella oxytoca NOT DETECTED NOT DETECTED Final   Klebsiella pneumoniae NOT DETECTED NOT DETECTED Final   Proteus species DETECTED (A) NOT DETECTED Final    Comment: CRITICAL RESULT CALLED TO, READ BACK BY AND VERIFIED WITH: Chales Abrahams PharmD 13:15 05/03/19 ([email or web address])    Serratia marcescens NOT DETECTED NOT DETECTED Final   Carbapenem resistance NOT DETECTED NOT DETECTED Final   Haemophilus influenzae NOT DETECTED NOT DETECTED Final   Neisseria meningitidis NOT DETECTED NOT DETECTED Final   Pseudomonas aeruginosa NOT DETECTED NOT DETECTED Final   Candida albicans NOT DETECTED NOT DETECTED Final   Candida glabrata NOT DETECTED NOT DETECTED Final   Candida krusei NOT DETECTED NOT DETECTED Final   Candida parapsilosis NOT DETECTED NOT DETECTED Final   Candida tropicalis NOT DETECTED NOT DETECTED Final    Comment: Performed at [facility], [place] [address] [place], [place] [address]  Culture, blood (routine x 2)     Status: None (Preliminary result)   Collection Time: 05/02/19  7:05 PM   Specimen: BLOOD  Result Value Ref Range Status   Specimen Description   Final    BLOOD RIGHT ANTECUBITAL Performed at [facility] [address], [place], [place] [address]    Special Requests   Final    BOTTLES DRAWN AEROBIC AND ANAEROBIC Blood Culture adequate volume Performed at [facility] [address], [place], [place] [address]    Culture  Setup Time   Final    GRAM NEGATIVE RODS ANAEROBIC BOTTLE ONLY CRITICAL VALUE NOTED.  VALUE IS CONSISTENT WITH PREVIOUSLY REPORTED AND CALLED VALUE.    Culture   Final    NO GROWTH < 12 HOURS Performed at [facility], [place] [address]  [place], [place] [address]    Report Status PENDING  Incomplete  C difficile quick scan w PCR reflex     Status: Abnormal   Collection Time: 05/02/19 11:42 PM   Specimen: STOOL  Result Value Ref Range Status   C Diff antigen POSITIVE (A) NEGATIVE Final   C Diff toxin NEGATIVE NEGATIVE Final   C Diff interpretation Results are indeterminate. See PCR results.  Final    Comment: Performed at [facility], [place] [address], [place], [place] [address]   Creatinine: Recent Labs    05/02/19 1919 05/03/19 0336  CREATININE 0.91 0.89   Baseline Creatinine: 0.9  Impression/Assessment:  58yo with a right ureteral calculus, sepsis from a urinary source  Plan:  The risks/benefits/alternatives to right ureteral stent placement was explained to the patient and she understands and wishes to proceed with surgery. She will be taken urgently to the operating room for right ureteral stent placement.   Nicolette Bang 05/03/2019, 5:52 PM

## 2019-05-03 NOTE — Progress Notes (Signed)
Stool sample was collected for c-diff quick scan, stool was checked by two RNs, [place].

## 2019-05-03 NOTE — Consult Note (Signed)
Urology Consult  Referring physician: Dr. Posey Pronto Reason for referral: nephrolithiasis/pyelonephritis  Chief Complaint: right flank pain  History of Present Illness: [name] is a 58yo with a hx of nephrolithiasis who presented to the ER yesterday with abdominal pain, nausea/vomiting and fevers. She is followed by Dr. Karsten Ro for nephrolithiasis. She was diagnosed with a right ureteral calculus 1 month ago and was on medical expulsive therapy. She had been doing well and thought she had passed her calculus. Since admission she has had persistent nausea and vomiting. She has associated right flank pain that is sharp, constant, severe and nonraditing. No toher associated symptoms. No other exacerbating/alleviaiting events. She developed a fever of 101.6 today. CT scan from today shows a 57m right UPJ calculus with moderate hydronephrosis  Past Medical History:  Diagnosis Date  . Allergy    seasonal  . Anemia   . Arthritis   . Barrett esophagus   . Blood transfusion abn reaction or complication, no procedure mishap 1968   after kidney surgery  . Blood transfusion without reported diagnosis   . DJD (degenerative joint disease)    fingers and ankle  . Dyspnea    vomiting  . GERD (gastroesophageal reflux disease)   . H. pylori infection   . Heart murmur   . History of kidney stones   . Hypothyroidism   . Left sided ulcerative (chronic) colitis ([place] 02/[place]  . Obesity   . Onychomycosis   . Overactive bladder   . Plantar fasciitis   . RBBB   . Reflux   . Ulcer    Past Surgical History:  Procedure Laterality Date  . CHOLECYSTECTOMY  2002  . COLONOSCOPY    . ESOPHAGOGASTRODUODENOSCOPY (EGD) WITH PROPOFOL N/A 01/17/2017   Procedure: ESOPHAGOGASTRODUODENOSCOPY (EGD) WITH PROPOFOL;  Surgeon: MJohnathan Hausen MD;  Location: [facility];  Service: General;  Laterality: N/A;  . KIDNEY SURGERY  1966  . LAPAROSCOPIC GASTRIC BANDING  08/07/2007   repair hiatal hernia, Dr MHassell Done . UPPER  GASTROINTESTINAL ENDOSCOPY      Medications: I have reviewed the patient's current medications. Allergies:  Allergies  Allergen Reactions  . Sulfonamide Derivatives Shortness Of Breath and Rash  . Contrast Media [Iodinated Diagnostic Agents] Hives    Patient takes Benadryl when getting the diagnostic agents, seems to be fine after.  . Doxycycline Nausea And Vomiting    Family History  Problem Relation Age of Onset  . Melanoma Brother   . Pancreatic cancer Brother 595      in liver and stomach, ? pancreatic main source  . GI problems Mother        polyps, diverticulosis  . Peripheral vascular disease Mother   . Hyperlipidemia Mother   . Hiatal hernia Mother   . Coronary artery disease Mother   . Other Mother        DJD  . GER disease Father   . Hyperlipidemia Father   . Other Father        DJD  . Transient ischemic attack Father        ocular  . Pancreatic cancer Maternal Grandfather   . Pancreatic cancer Paternal Grandfather   . Breast cancer Other   . Breast cancer Other   . Colon cancer Neg Hx   . Esophageal cancer Neg Hx   . Esophageal varices Neg Hx    Social History:  reports that she has never smoked. She has never used smokeless tobacco. She reports current alcohol use. She reports that she  does not use drugs.  Review of Systems  Constitutional: Positive for chills and fever.  Gastrointestinal: Positive for nausea and vomiting.  Genitourinary: Positive for flank pain.  All other systems reviewed and are negative.   Physical Exam:  Vital signs in last 24 hours: Temp:  [98.4 F (36.9 C)-102.6 F (39.2 C)] 102.6 F (39.2 C) (06/19 1713) Pulse Rate:  [65-152] 88 (06/19 1713) Resp:  [16-26] 16 (06/19 1713) BP: (111-151)/(56-75) 144/66 (06/19 1713) SpO2:  [88 %-100 %] 91 % (06/19 1713) Physical Exam  Constitutional: She is oriented to person, place, and time. She appears well-developed and well-nourished.  HENT:  Head: Normocephalic and atraumatic.   Eyes: Pupils are equal, round, and reactive to light. EOM are normal.  Neck: Normal range of motion. No thyromegaly present.  Cardiovascular: Normal rate and regular rhythm.  Respiratory: Effort normal. No respiratory distress.  GI: Soft. She exhibits no distension.  Musculoskeletal: Normal range of motion.        General: No edema.  Neurological: She is alert and oriented to person, place, and time.  Skin: Skin is warm and dry.  Psychiatric: She has a normal mood and affect. Her behavior is normal. Judgment and thought content normal.    Laboratory Data:  Results for orders placed or performed during the hospital encounter of 05/02/19 (from the past 72 hour(s))  Urinalysis, Routine w reflex microscopic     Status: Abnormal   Collection Time: 05/02/19  6:14 PM  Result Value Ref Range   Color, Urine YELLOW YELLOW   APPearance HAZY (A) CLEAR   Specific Gravity, Urine 1.011 1.005 - 1.030   pH 6.0 5.0 - 8.0   Glucose, UA NEGATIVE NEGATIVE mg/dL   Hgb urine dipstick MODERATE (A) NEGATIVE   Bilirubin Urine NEGATIVE NEGATIVE   Ketones, ur NEGATIVE NEGATIVE mg/dL   Protein, ur 30 (A) NEGATIVE mg/dL   Nitrite POSITIVE (A) NEGATIVE   Leukocytes,Ua MODERATE (A) NEGATIVE   RBC / HPF 0-5 0 - 5 RBC/hpf   WBC, UA >50 (H) 0 - 5 WBC/hpf   Bacteria, UA RARE (A) NONE SEEN   Squamous Epithelial / LPF 0-5 0 - 5   Mucus PRESENT     Comment: Performed at [facility], [place] [address] [place], [place] [address]  SARS Coronavirus 2 (CEPHEID- Performed in [place] hospital lab), Hosp Order     Status: None   Collection Time: 05/02/19  6:36 PM   Specimen: Nasopharyngeal Swab  Result Value Ref Range   SARS Coronavirus 2 NEGATIVE NEGATIVE    Comment: (NOTE) If result is NEGATIVE SARS-CoV-2 target nucleic acids are NOT DETECTED. The SARS-CoV-2 RNA is generally detectable in upper and lower  respiratory specimens during the acute phase of infection. The lowest  concentration of  SARS-CoV-2 viral copies this assay can detect is 250  copies / mL. A negative result does not preclude SARS-CoV-2 infection  and should not be used as the sole basis for treatment or other  patient management decisions.  A negative result may occur with  improper specimen collection / handling, submission of specimen other  than nasopharyngeal swab, presence of viral mutation(s) within the  areas targeted by this assay, and inadequate number of viral copies  (<250 copies / mL). A negative result must be combined with clinical  observations, patient history, and epidemiological information. If result is POSITIVE SARS-CoV-2 target nucleic acids are DETECTED. The SARS-CoV-2 RNA is generally detectable in upper and lower  respiratory specimens  dur ing the acute phase of infection.  Positive  results are indicative of active infection with SARS-CoV-2.  Clinical  correlation with patient history and other diagnostic information is  necessary to determine patient infection status.  Positive results do  not rule out bacterial infection or co-infection with other viruses. If result is PRESUMPTIVE POSTIVE SARS-CoV-2 nucleic acids MAY BE PRESENT.   A presumptive positive result was obtained on the submitted specimen  and confirmed on repeat testing.  While 2019 novel coronavirus  (SARS-CoV-2) nucleic acids may be present in the submitted sample  additional confirmatory testing may be necessary for epidemiological  and / or clinical management purposes  to differentiate between  SARS-CoV-2 and other Sarbecovirus currently known to infect humans.  If clinically indicated additional testing with an alternate test  methodology [phone]) is advised. The SARS-CoV-2 RNA is generally  detectable in upper and lower respiratory sp ecimens during the acute  phase of infection. The expected result is Negative. Fact Sheet for Patients:  StrictlyIdeas.no Fact Sheet for Healthcare  Providers: BankingDealers.co.za This test is not yet approved or cleared by the [place] FDA and has been authorized for detection and/or diagnosis of SARS-CoV-2 by FDA under an Emergency Use Authorization (EUA).  This EUA will remain in effect (meaning this test can be used) for the duration of the COVID-19 declaration under Section 564(b)(1) of the Act, 21 U.S.C. section 360bbb-3(b)(1), unless the authorization is terminated or revoked sooner. Performed at [facility], [place] [address], [place], [place] [address]   Gastrointestinal Panel by PCR , Stool     Status: None   Collection Time: 05/02/19  6:37 PM   Specimen: Stool  Result Value Ref Range   Campylobacter species NOT DETECTED NOT DETECTED   Plesimonas shigelloides NOT DETECTED NOT DETECTED   Salmonella species NOT DETECTED NOT DETECTED   Yersinia enterocolitica NOT DETECTED NOT DETECTED   Vibrio species NOT DETECTED NOT DETECTED   Vibrio cholerae NOT DETECTED NOT DETECTED   Enteroaggregative E coli (EAEC) NOT DETECTED NOT DETECTED   Enteropathogenic E coli (EPEC) NOT DETECTED NOT DETECTED   Enterotoxigenic E coli (ETEC) NOT DETECTED NOT DETECTED   Shiga like toxin producing E coli (STEC) NOT DETECTED NOT DETECTED   Shigella/Enteroinvasive E coli (EIEC) NOT DETECTED NOT DETECTED   Cryptosporidium NOT DETECTED NOT DETECTED   Cyclospora cayetanensis NOT DETECTED NOT DETECTED   Entamoeba histolytica NOT DETECTED NOT DETECTED   Giardia lamblia NOT DETECTED NOT DETECTED   Adenovirus F40/41 NOT DETECTED NOT DETECTED   Astrovirus NOT DETECTED NOT DETECTED   Norovirus GI/GII NOT DETECTED NOT DETECTED   Rotavirus A NOT DETECTED NOT DETECTED   Sapovirus (I, II, IV, and V) NOT DETECTED NOT DETECTED    Comment: Performed at [facility], [place] [place], [place] [address]  Culture, blood (routine x 2)     Status: None (Preliminary result)   Collection Time: 05/02/19  6:58  PM   Specimen: BLOOD  Result Value Ref Range   Specimen Description      BLOOD LEFT ANTECUBITAL Performed at [facility], [place] [address] [place], [place] [address]    Special Requests      BOTTLES DRAWN AEROBIC AND ANAEROBIC Blood Culture adequate volume Performed at [place] [address] [place], [place] [address]    Culture  Setup Time      GRAM NEGATIVE RODS ANAEROBIC BOTTLE ONLY Organism ID to follow CRITICAL RESULT CALLED TO,  READ BACK BY AND VERIFIED WITH: Chales Abrahams PharmD 13:15 05/03/19 ([email or web address])    Culture      NO GROWTH < 12 HOURS Performed at [facility], [address], [place], [place] [address]    Report Status PENDING   Blood Culture ID Panel (Reflexed)     Status: Abnormal   Collection Time: 05/02/19  6:58 PM  Result Value Ref Range   Enterococcus species NOT DETECTED NOT DETECTED   Listeria monocytogenes NOT DETECTED NOT DETECTED   Staphylococcus species NOT DETECTED NOT DETECTED   Staphylococcus aureus (BCID) NOT DETECTED NOT DETECTED   Streptococcus species NOT DETECTED NOT DETECTED   Streptococcus agalactiae NOT DETECTED NOT DETECTED   Streptococcus pneumoniae NOT DETECTED NOT DETECTED   Streptococcus pyogenes NOT DETECTED NOT DETECTED   Acinetobacter baumannii NOT DETECTED NOT DETECTED   Enterobacteriaceae species DETECTED (A) NOT DETECTED    Comment: Enterobacteriaceae represent a large family of gram-negative bacteria, not a single organism. CRITICAL RESULT CALLED TO, READ BACK BY AND VERIFIED WITH: Chales Abrahams PharmD 13:15 05/03/19 ([email or web address])    Enterobacter cloacae complex NOT DETECTED NOT DETECTED   Escherichia coli NOT DETECTED NOT DETECTED   Klebsiella oxytoca NOT DETECTED NOT DETECTED   Klebsiella pneumoniae NOT DETECTED NOT DETECTED   Proteus species DETECTED (A) NOT DETECTED    Comment: CRITICAL RESULT CALLED TO, READ BACK BY AND VERIFIED WITH: Chales Abrahams PharmD 13:15 05/03/19 ([email or web address])    Serratia  marcescens NOT DETECTED NOT DETECTED   Carbapenem resistance NOT DETECTED NOT DETECTED   Haemophilus influenzae NOT DETECTED NOT DETECTED   Neisseria meningitidis NOT DETECTED NOT DETECTED   Pseudomonas aeruginosa NOT DETECTED NOT DETECTED   Candida albicans NOT DETECTED NOT DETECTED   Candida glabrata NOT DETECTED NOT DETECTED   Candida krusei NOT DETECTED NOT DETECTED   Candida parapsilosis NOT DETECTED NOT DETECTED   Candida tropicalis NOT DETECTED NOT DETECTED    Comment: Performed at [facility], [place] [address] [place], [place] [address]  Culture, blood (routine x 2)     Status: None (Preliminary result)   Collection Time: 05/02/19  7:05 PM   Specimen: BLOOD  Result Value Ref Range   Specimen Description      BLOOD RIGHT ANTECUBITAL Performed at [facility] [address], [place], [place] [address]    Special Requests      BOTTLES DRAWN AEROBIC AND ANAEROBIC Blood Culture adequate volume Performed at [place] [address] [place], [place] [address]    Culture  Setup Time      GRAM NEGATIVE RODS ANAEROBIC BOTTLE ONLY CRITICAL VALUE NOTED.  VALUE IS CONSISTENT WITH PREVIOUSLY REPORTED AND CALLED VALUE.    Culture      NO GROWTH < 12 HOURS Performed at [facility] [address] [place], [place] [address]    Report Status PENDING   Lactic acid, plasma     Status: None   Collection Time: 05/02/19  7:18 PM  Result Value Ref Range   Lactic Acid, Venous 1.7 0.5 - 1.9 mmol/L    Comment: Performed at [facility], [place] [address], [place], [place] [address]  Comprehensive metabolic panel     Status: Abnormal   Collection Time: 05/02/19  7:19 PM  Result Value Ref Range   Sodium 141 135 - 145 mmol/L   Potassium 3.0 (L) 3.5 - 5.1 mmol/L   Chloride 104 98 - 111 mmol/L   CO2 25  22 - 32 mmol/L   Glucose, Bld 134 (H) 70 - 99 mg/dL   BUN 6 6 - 20 mg/dL   Creatinine, Ser 0.91 0.44 - 1.00 mg/dL    Calcium 8.6 (L) 8.9 - 10.3 mg/dL   Total Protein 6.9 6.5 - 8.1 g/dL   Albumin 3.5 3.5 - 5.0 g/dL   AST 21 15 - 41 U/L   ALT 19 0 - 44 U/L   Alkaline Phosphatase 88 38 - 126 U/L   Total Bilirubin 0.9 0.3 - 1.2 mg/dL   GFR calc non Af Amer >60 >60 mL/min   GFR calc Af Amer >60 >60 mL/min   Anion gap 12 5 - 15    Comment: Performed at [facility], [place] [address] [place], [place] [address]  CBC with Differential     Status: Abnormal   Collection Time: 05/02/19  7:19 PM  Result Value Ref Range   WBC 11.7 (H) 4.0 - 10.5 K/uL   RBC 4.63 3.87 - 5.11 MIL/uL   Hemoglobin 13.3 12.0 - 15.0 g/dL   HCT 41.5 36.0 - 46.0 %   MCV 89.6 80.0 - 100.0 fL   MCH 28.7 26.0 - 34.0 pg   MCHC 32.0 30.0 - 36.0 g/dL   RDW 14.5 11.5 - 15.5 %   Platelets 312 150 - 400 K/uL   nRBC 0.0 0.0 - 0.2 %   Neutrophils Relative % 80 %   Neutro Abs 9.4 (H) 1.7 - 7.7 K/uL   Lymphocytes Relative 12 %   Lymphs Abs 1.4 0.7 - 4.0 K/uL   Monocytes Relative 7 %   Monocytes Absolute 0.8 0.1 - 1.0 K/uL   Eosinophils Relative 0 %   Eosinophils Absolute 0.0 0.0 - 0.5 K/uL   Basophils Relative 1 %   Basophils Absolute 0.1 0.0 - 0.1 K/uL   Immature Granulocytes 0 %   Abs Immature Granulocytes 0.05 0.00 - 0.07 K/uL    Comment: Performed at [facility], [place] [address], [place], [place] [address]  Magnesium     Status: None   Collection Time: 05/02/19  7:19 PM  Result Value Ref Range   Magnesium 1.8 1.7 - 2.4 mg/dL    Comment: Performed at [facility], [place] [address], [place], [place] [address]  C difficile quick scan w PCR reflex     Status: Abnormal   Collection Time: 05/02/19 11:42 PM   Specimen: STOOL  Result Value Ref Range   C Diff antigen POSITIVE (A) NEGATIVE   C Diff toxin NEGATIVE NEGATIVE   C Diff interpretation Results are indeterminate. See PCR results.     Comment: Performed at [facility], [place] [address] [place], [place]  [address]  Magnesium     Status: None   Collection Time: 05/03/19 12:29 AM  Result Value Ref Range   Magnesium 1.9 1.7 - 2.4 mg/dL    Comment: Performed at [facility], [place] [address], [place], [place] [address]  Phosphorus     Status: None   Collection Time: 05/03/19 12:29 AM  Result Value Ref Range   Phosphorus 3.3 2.5 - 4.6 mg/dL    Comment: Performed at [facility], [place] [address] [place], [place] [address]  Basic metabolic panel     Status: Abnormal   Collection Time: 05/03/19  3:36 AM  Result Value Ref Range   Sodium 139 135 - 145 mmol/L   Potassium 3.1 (L) 3.5 - 5.1  mmol/L   Chloride 107 98 - 111 mmol/L   CO2 21 (L) 22 - 32 mmol/L   Glucose, Bld 150 (H) 70 - 99 mg/dL   BUN 9 6 - 20 mg/dL   Creatinine, Ser 0.89 0.44 - 1.00 mg/dL   Calcium 8.0 (L) 8.9 - 10.3 mg/dL   GFR calc non Af Amer >60 >60 mL/min   GFR calc Af Amer >60 >60 mL/min   Anion gap 11 5 - 15    Comment: Performed at [facility], [place] [address] [place], [place] [address]  CBC     Status: Abnormal   Collection Time: 05/03/19  3:36 AM  Result Value Ref Range   WBC 15.0 (H) 4.0 - 10.5 K/uL   RBC 4.30 3.87 - 5.11 MIL/uL   Hemoglobin 12.5 12.0 - 15.0 g/dL   HCT 38.8 36.0 - 46.0 %   MCV 90.2 80.0 - 100.0 fL   MCH 29.1 26.0 - 34.0 pg   MCHC 32.2 30.0 - 36.0 g/dL   RDW 14.6 11.5 - 15.5 %   Platelets 270 150 - 400 K/uL   nRBC 0.0 0.0 - 0.2 %    Comment: Performed at [facility], [place] [address], [place], [place] [address]  Glucose, capillary     Status: Abnormal   Collection Time: 05/03/19 10:18 AM  Result Value Ref Range   Glucose-Capillary 126 (H) 70 - 99 mg/dL   Recent Results (from the past 240 hour(s))  SARS Coronavirus 2 (CEPHEID- Performed in [place] hospital lab), Hosp Order     Status: None   Collection Time: 05/02/19  6:36 PM   Specimen: Nasopharyngeal Swab  Result Value Ref Range Status   SARS Coronavirus 2 NEGATIVE  NEGATIVE Final    Comment: (NOTE) If result is NEGATIVE SARS-CoV-2 target nucleic acids are NOT DETECTED. The SARS-CoV-2 RNA is generally detectable in upper and lower  respiratory specimens during the acute phase of infection. The lowest  concentration of SARS-CoV-2 viral copies this assay can detect is 250  copies / mL. A negative result does not preclude SARS-CoV-2 infection  and should not be used as the sole basis for treatment or other  patient management decisions.  A negative result may occur with  improper specimen collection / handling, submission of specimen other  than nasopharyngeal swab, presence of viral mutation(s) within the  areas targeted by this assay, and inadequate number of viral copies  (<250 copies / mL). A negative result must be combined with clinical  observations, patient history, and epidemiological information. If result is POSITIVE SARS-CoV-2 target nucleic acids are DETECTED. The SARS-CoV-2 RNA is generally detectable in upper and lower  respiratory specimens dur ing the acute phase of infection.  Positive  results are indicative of active infection with SARS-CoV-2.  Clinical  correlation with patient history and other diagnostic information is  necessary to determine patient infection status.  Positive results do  not rule out bacterial infection or co-infection with other viruses. If result is PRESUMPTIVE POSTIVE SARS-CoV-2 nucleic acids MAY BE PRESENT.   A presumptive positive result was obtained on the submitted specimen  and confirmed on repeat testing.  While 2019 novel coronavirus  (SARS-CoV-2) nucleic acids may be present in the submitted sample  additional confirmatory testing may be necessary for epidemiological  and / or clinical management purposes  to differentiate between  SARS-CoV-2 and other Sarbecovirus currently known to infect humans.  If clinically indicated additional testing with an alternate  test  methodology [phone]) is  advised. The SARS-CoV-2 RNA is generally  detectable in upper and lower respiratory sp ecimens during the acute  phase of infection. The expected result is Negative. Fact Sheet for Patients:  StrictlyIdeas.no Fact Sheet for Healthcare Providers: BankingDealers.co.za This test is not yet approved or cleared by the [place] FDA and has been authorized for detection and/or diagnosis of SARS-CoV-2 by FDA under an Emergency Use Authorization (EUA).  This EUA will remain in effect (meaning this test can be used) for the duration of the COVID-19 declaration under Section 564(b)(1) of the Act, 21 U.S.C. section 360bbb-3(b)(1), unless the authorization is terminated or revoked sooner. Performed at [facility], [place] [address] [place], [place] [address]   Gastrointestinal Panel by PCR , Stool     Status: None   Collection Time: 05/02/19  6:37 PM   Specimen: Stool  Result Value Ref Range Status   Campylobacter species NOT DETECTED NOT DETECTED Final   Plesimonas shigelloides NOT DETECTED NOT DETECTED Final   Salmonella species NOT DETECTED NOT DETECTED Final   Yersinia enterocolitica NOT DETECTED NOT DETECTED Final   Vibrio species NOT DETECTED NOT DETECTED Final   Vibrio cholerae NOT DETECTED NOT DETECTED Final   Enteroaggregative E coli (EAEC) NOT DETECTED NOT DETECTED Final   Enteropathogenic E coli (EPEC) NOT DETECTED NOT DETECTED Final   Enterotoxigenic E coli (ETEC) NOT DETECTED NOT DETECTED Final   Shiga like toxin producing E coli (STEC) NOT DETECTED NOT DETECTED Final   Shigella/Enteroinvasive E coli (EIEC) NOT DETECTED NOT DETECTED Final   Cryptosporidium NOT DETECTED NOT DETECTED Final   Cyclospora cayetanensis NOT DETECTED NOT DETECTED Final   Entamoeba histolytica NOT DETECTED NOT DETECTED Final   Giardia lamblia NOT DETECTED NOT DETECTED Final   Adenovirus F40/41 NOT DETECTED NOT DETECTED Final    Astrovirus NOT DETECTED NOT DETECTED Final   Norovirus GI/GII NOT DETECTED NOT DETECTED Final   Rotavirus A NOT DETECTED NOT DETECTED Final   Sapovirus (I, II, IV, and V) NOT DETECTED NOT DETECTED Final    Comment: Performed at [facility], [place] [place], [place] [address]  Culture, blood (routine x 2)     Status: None (Preliminary result)   Collection Time: 05/02/19  6:58 PM   Specimen: BLOOD  Result Value Ref Range Status   Specimen Description   Final    BLOOD LEFT ANTECUBITAL Performed at [facility], [place] [address] [place], [place] [address]    Special Requests   Final    BOTTLES DRAWN AEROBIC AND ANAEROBIC Blood Culture adequate volume Performed at [facility] [address] [place], [place] [address]    Culture  Setup Time   Final    GRAM NEGATIVE RODS ANAEROBIC BOTTLE ONLY Organism ID to follow CRITICAL RESULT CALLED TO, READ BACK BY AND VERIFIED WITH: Chales Abrahams PharmD 13:15 05/03/19 ([email or web address])    Culture   Final    NO GROWTH < 12 HOURS Performed at [facility], [address] [place], [place] [address]    Report Status PENDING  Incomplete  Blood Culture ID Panel (Reflexed)     Status: Abnormal   Collection Time: 05/02/19  6:58 PM  Result Value Ref Range Status   Enterococcus species NOT DETECTED NOT DETECTED Final   Listeria monocytogenes NOT DETECTED NOT DETECTED Final   Staphylococcus species NOT DETECTED NOT DETECTED Final   Staphylococcus aureus (BCID) NOT DETECTED NOT DETECTED Final  Streptococcus species NOT DETECTED NOT DETECTED Final   Streptococcus agalactiae NOT DETECTED NOT DETECTED Final   Streptococcus pneumoniae NOT DETECTED NOT DETECTED Final   Streptococcus pyogenes NOT DETECTED NOT DETECTED Final   Acinetobacter baumannii NOT DETECTED NOT DETECTED Final   Enterobacteriaceae species DETECTED (A) NOT DETECTED Final    Comment: Enterobacteriaceae represent a large family of  gram-negative bacteria, not a single organism. CRITICAL RESULT CALLED TO, READ BACK BY AND VERIFIED WITH: Chales Abrahams PharmD 13:15 05/03/19 ([email or web address])    Enterobacter cloacae complex NOT DETECTED NOT DETECTED Final   Escherichia coli NOT DETECTED NOT DETECTED Final   Klebsiella oxytoca NOT DETECTED NOT DETECTED Final   Klebsiella pneumoniae NOT DETECTED NOT DETECTED Final   Proteus species DETECTED (A) NOT DETECTED Final    Comment: CRITICAL RESULT CALLED TO, READ BACK BY AND VERIFIED WITH: Chales Abrahams PharmD 13:15 05/03/19 ([email or web address])    Serratia marcescens NOT DETECTED NOT DETECTED Final   Carbapenem resistance NOT DETECTED NOT DETECTED Final   Haemophilus influenzae NOT DETECTED NOT DETECTED Final   Neisseria meningitidis NOT DETECTED NOT DETECTED Final   Pseudomonas aeruginosa NOT DETECTED NOT DETECTED Final   Candida albicans NOT DETECTED NOT DETECTED Final   Candida glabrata NOT DETECTED NOT DETECTED Final   Candida krusei NOT DETECTED NOT DETECTED Final   Candida parapsilosis NOT DETECTED NOT DETECTED Final   Candida tropicalis NOT DETECTED NOT DETECTED Final    Comment: Performed at [facility], [place] [address] [place], [place] [address]  Culture, blood (routine x 2)     Status: None (Preliminary result)   Collection Time: 05/02/19  7:05 PM   Specimen: BLOOD  Result Value Ref Range Status   Specimen Description   Final    BLOOD RIGHT ANTECUBITAL Performed at [facility] [address] [place], [place] [address]    Special Requests   Final    BOTTLES DRAWN AEROBIC AND ANAEROBIC Blood Culture adequate volume Performed at [facility] [address] [place], [place] [address]    Culture  Setup Time   Final    GRAM NEGATIVE RODS ANAEROBIC BOTTLE ONLY CRITICAL VALUE NOTED.  VALUE IS CONSISTENT WITH PREVIOUSLY REPORTED AND CALLED VALUE.    Culture   Final    NO GROWTH < 12 HOURS Performed at [facility], [place] [address]  [place], [place] [address]    Report Status PENDING  Incomplete  C difficile quick scan w PCR reflex     Status: Abnormal   Collection Time: 05/02/19 11:42 PM   Specimen: STOOL  Result Value Ref Range Status   C Diff antigen POSITIVE (A) NEGATIVE Final   C Diff toxin NEGATIVE NEGATIVE Final   C Diff interpretation Results are indeterminate. See PCR results.  Final    Comment: Performed at [facility], [place] [address], [place],  [address]   Creatinine: Recent Labs    05/02/19 1919 05/03/19 0336  CREATININE 0.91 0.89   Baseline Creatinine: 0.9  Impression/Assessment:  58yo with a right ureteral calculus, sepsis from a urinary source  Plan:  The risks/benefits/alternatives to right ureteral stent placement was explained to the patient and she understands and wishes to proceed with surgery. She will be taken urgently to the operating room for right ureteral stent placement.   Nicolette Bang 05/03/2019, 5:52 PM

## 2019-05-03 NOTE — Progress Notes (Signed)
PT Cancellation Note  Patient Details Name: Yoneko Talerico MRN: [id number] DOB: 11-Jul-1961   Cancelled Treatment:    Reason Eval/Treat Not Completed: PT screened, no needs identified, will sign off(pt reports she's not having difficulty with mobility, she's been able to independenlty walk to the bathroom, but movement makes her nauseous. Will sign off as pt is independent with mobility. Encouraged pt to ambulate once nausea resolves.)  Philomena Doheny PT 05/03/2019  Acute Rehabilitation Services Pager [phone] Office [phone]

## 2019-05-03 NOTE — Plan of Care (Signed)

## 2019-05-03 NOTE — Progress Notes (Signed)
[facility] Progress Note  Patient: Joyce Kim YIR:[id number]   PCP: Prince Solian, MD DOB: Apr 06, 1961   DOA: 05/02/2019   DOS: 05/03/2019   Date of Service: the patient was seen and examined on 05/03/2019  Brief hospital course: Pt. with PMH of ulcerative colitis, GERD, hypothyroidism, recent right-sided kidney stone with hydronephrosis; admitted on 05/02/2019, presented with complaint of abdominal pain, fever, diarrhea and generalized body ache, was found to have ulcerative colitis flareup,?  C. difficile, pyelonephritis. Currently further plan is continue current antibiotic, follow-up GI recommendation, will require abdominal imaging.  Subjective: Continues to report nausea and episode of vomiting this morning.  Continues to have diarrhea as well.  No blood in the stool.  She reports the pain is located in the epigastric and midabdominal area and she reports that this pain is different than the kidney stone pain that she had last week. Denies any burning urination or blood in the urine. Requesting all medication to be switched to IV secondary to nausea and vomiting.  Assessment and Plan: 1.  Acute on chronic exacerbation of ulcerative colitis. C. difficile antigen positive, PCR pending, toxin negative. Continue IV Flagyl for now. GI consulted. Continue IV Rocephin as well.  2.  Acute pyonephritis. Right ureteral stone with mild hydroureteronephrosis Left renal stone. Patient has seen Dr. Karsten Ro family or urology. Reports that this pain is different than her kidney stone pain. She actually reports that she has passed the kidney stone. Urine does have evidence of pyuria.  With fever and prior CT scan finding on 03/28/2019 there is a concern for pyelonephritis. Patient will require further imaging, will prefer ultrasound renal if CT abdomen is not indicated by GI.  3.  Hiatal hernia. History of lap banding GERD Patient had prior history of large lateral hernia  repaired with bio mesh and lap band in 2008. CT on 03/28/2019 shows lap band positioning vertically. Unsure whether this is associated with the patient's abdominal pain. We will follow-up on GI recommendation. Procedure was performed by Dr. Hassell Done general surgery.  4.  Hypokalemia. Replacing IV.  5.  Hypothyroidism. Continue Synthroid. May require IV Synthroid if patient is unable to tolerate p.o.  6. Mood disorder. On Lexapro Currently on hold.  7.  Morbid obesity Body mass index is 43.74 kg/m.  S/P lap band. Dietary consultation.  Diet: Clear liquid diet DVT Prophylaxis: Subcutaneous Lovenox  Advance goals of care discussion: Full code  Family Communication: no family was present at bedside, at the time of interview.   Disposition:  Discharge to Home 2 to 3 days.  Consultants: gastroenterology  Procedures: none  Scheduled Meds: Continuous Infusions: . cefTRIAXone (ROCEPHIN)  IV    . lactated ringers 100 mL/hr at 05/03/19 0831  . metronidazole 500 mg (05/03/19 0832)  . potassium chloride     PRN Meds: acetaminophen **OR** acetaminophen, albuterol, ipratropium, morphine injection, ondansetron **OR** ondansetron (ZOFRAN) IV, oxyCODONE, promethazine Antibiotics: Anti-infectives (From admission, onward)   Start     Dose/Rate Route Frequency Ordered Stop   05/03/19 2200  cefTRIAXone (ROCEPHIN) 1 g in sodium chloride 0.9 % 100 mL IVPB     1 g 200 mL/hr over 30 Minutes Intravenous Every 24 hours 05/02/19 2341     05/03/19 0015  metroNIDAZOLE (FLAGYL) IVPB 500 mg     500 mg 100 mL/hr over 60 Minutes Intravenous Every 8 hours 05/03/19 0011     05/02/19 2145  cefTRIAXone (ROCEPHIN) 1 g in sodium chloride 0.9 % 100 mL IVPB  1 g 200 mL/hr over 30 Minutes Intravenous  Once 05/02/19 2135 05/02/19 2236       Objective: Physical Exam: Vitals:   05/02/19 2206 05/02/19 2230 05/02/19 2347 05/03/19 0627  BP: 111/62 127/61 (!) 123/56 (!) 151/74  Pulse: 72 75 65 85   Resp:  20 18 17   Temp:   98.8 F (37.1 C) 98.4 F (36.9 C)  TempSrc:   Oral Oral  SpO2: 92% 98% 100% 94%  Weight:      Height:        Intake/Output Summary (Last 24 hours) at 05/03/2019 0855 Last data filed at 05/03/2019 0600 Gross per 24 hour  Intake 756.86 ml  Output -  Net 756.86 ml   Filed Weights   05/02/19 1727  Weight: 122.9 kg   General: alert and oriented to time, place, and person. Appear in moderate distress, affect appropriate Eyes: PERRL, Conjunctiva normal ENT: Oral Mucosa Clear, dry  Neck: no JVD, no Abnormal Mass Or lumps Cardiovascular: S1 and S2 Present, no Murmur, peripheral pulses symmetrical Respiratory: normal respiratory effort, Bilateral Air entry equal and Decreased, no use of accessory muscle, Clear to Auscultation, no Crackles, no wheezes Abdomen: Bowel Sound present, Soft and mild diffuse tenderness, no hernia Skin: no rashes  Extremities: no Pedal edema, no calf tenderness Neurologic: normal without focal findings, mental status, speech normal, alert and oriented x3, PERLA, Motor strength 5/5 and symmetric and sensation grossly normal to light touch Gait not checked due to patient safety concerns  Data Reviewed: CBC: Recent Labs  Lab 05/02/19 1919 05/03/19 0336  WBC 11.7* 15.0*  NEUTROABS 9.4*  --   HGB 13.3 12.5  HCT 41.5 38.8  MCV 89.6 90.2  PLT 312 845   Basic Metabolic Panel: Recent Labs  Lab 05/02/19 1919 05/03/19 0029 05/03/19 0336  NA 141  --  139  K 3.0*  --  3.1*  CL 104  --  107  CO2 25  --  21*  GLUCOSE 134*  --  150*  BUN 6  --  9  CREATININE 0.91  --  0.89  CALCIUM 8.6*  --  8.0*  MG 1.8 1.9  --   PHOS  --  3.3  --     Liver Function Tests: Recent Labs  Lab 05/02/19 1919  AST 21  ALT 19  ALKPHOS 88  BILITOT 0.9  PROT 6.9  ALBUMIN 3.5   No results for input(s): LIPASE, AMYLASE in the last 168 hours. No results for input(s): AMMONIA in the last 168 hours. Coagulation Profile: No results for input(s):  INR, PROTIME in the last 168 hours. Cardiac Enzymes: No results for input(s): CKTOTAL, CKMB, CKMBINDEX, TROPONINI in the last 168 hours. BNP (last 3 results) No results for input(s): PROBNP in the last 8760 hours. CBG: No results for input(s): GLUCAP in the last 168 hours. Studies: Dg Abdomen 1 View  Result Date: 05/02/2019 CLINICAL DATA:  Diarrhea with abdominal bloating. EXAM: ABDOMEN - 1 VIEW COMPARISON:  CT scan 03/28/2019 FINDINGS: Supine view the abdomen shows no gaseous bowel dilatation to suggest obstruction. Lap band has a vertical orientation, stable since CT scan and slightly more vertical than on 8 KUB from 11/03/2016. Surgical clips right upper quadrant suggest prior cholecystectomy. Visualized bony anatomy unremarkable. IMPRESSION: No evidence for bowel obstruction. Vertical orientation of the lap band is similar to the recent CT scout image but appears to have become more vertical comparing to the KUB from 11/03/2016. Electronically Signed   By: Randall Hiss  Tery Sanfilippo M.D.   On: 05/02/2019 20:50   Dg Chest Portable 1 View  Result Date: 05/02/2019 CLINICAL DATA:  Shortness of breath EXAM: PORTABLE CHEST 1 VIEW COMPARISON:  08/03/2007 FINDINGS: Mild cardiomegaly. No focal opacity or pleural effusion. No pneumothorax. IMPRESSION: No active disease.  Cardiomegaly Electronically Signed   By: Donavan Foil M.D.   On: 05/02/2019 19:18     Time spent: 35 minutes  Author: Berle Mull, MD [facility] 05/03/2019 8:55 AM  To reach On-call, see care teams to locate the attending and reach out to them via [email or web address]. If 7PM-7AM, please contact night-coverage If you still have difficulty reaching the attending provider, please page the [facility] (Director on Call) for [facility] on amion for assistance.

## 2019-05-03 NOTE — Consult Note (Addendum)
Referring Provider:  [facility]         Primary Care Physician:  Prince Solian, MD Primary Gastroenterologist:   Silvano Rusk, MD Reason for Consultation:  UC, Abdominal pain, diarrhea               ASSESSMENT /  PLAN    1. 58 yo female with hx of gastric lap band and UC (maintained on Mesalamine) admitted with fever, nausea / vomiting , acute on chronic diarrhea, acute on chronic abdominal pain, and arthralgias.  -C-diff toxin negative. Awaiting stool culture. Agree with flagyl in the interim. If infectious evaluation negative she may need repeat colonoscopy and / or escalation of UC therapy.  -Blood cultures pending -holding off on steroids until infectious workup complete    2. Pyelonephritis. Fevers and abdominal pain could be secondary to #1. Urine culture pending. On Rocephin    Attending Physician Note   I have taken a history, examined the patient and reviewed the chart. I agree with the Advanced Practitioner's note, impression and recommendations.  Abd pain, N/V, acute on chronic diarrhea Pyelonephritis  Ulcerative colitis, mesalamine restarted several weeks and a course of prednisone have not helped her diarrhea S/P lap band  Acute GI symptoms could be secondary to pyelonephritis - assess response to treatment of pyelonephritis Acute on chronic diarrhea does not appear to be a UC flare Await stool studies Pending course and above results colonoscopy as outpatient might be needed to further evaluate  Resume mesalamine, Lialda 2.4 po bid when N/V resolves Dicyclomine 10 mg po qid GI signing off, available if needed Follow up with Silvano Rusk, MD as outpatient  Lucio Edward, MD [facility] [phone]     BRIEF HISTORY   Joyce Kim is a 58 y.o. female with hypothyroidism, GERD, and UC (? Universal based on path) diagnosed Nov 2018. Intermittent diarrhea for months, worse over last two weeks with associated fevers, arthralgia and worsening of chronic  abdominal pain.   ED EVALUATION   Temp 101.6 >>> resolved HR 150's >> resolved 02 sat 88% SARS negative K+ 3.0, normal renal function. Normal liver tests WBC 11.7 >>> 15 today Hgb 13.3 KUB >> no acute findings.    HPI:   58 yo female diagnosed with UC after undergoing colonoscopy November 2018 for evaluation of heme positive stool and chronic diarrhea.  Initially she did well on escalating dose of Lialda and then around October 2019 she began having intermittent diarrhea.  She had a tele-visit with [place] in late March at which time she was given a prednisone taper for possible UC flare.  Patient says she took 40 mg of prednisone daily for a week then tapered off over the following 7 days.  She never did notice any improvement in the diarrhea while on prednisone. A couple of weeks ago diarrhea increased from 3-4 times a day to about 8 times a day. Increased non-bloody diarrhea was associated with fevers, nausea / vomiting, worsening of her chronic left mid abdominal pain, and arthralgia of knees and wrists.She has been compliant with Lialda.  Patient had antibiotics 3 weeks ago for sinus infection but her C. difficile toxin this admission is negative.   Patient endorses recent but she associates it with the nausea and vomiting.  She also notes some recent shortness of breath but feels it is mainly due to the abdominal pain.  Acute findings on chest x-ray, COVID-19 negative. No urinary symptoms    PREVIOUS GASTROINTESTINAL STUDIES   Nov  2018 colonoscopy Ulcerative colitis. Inflammation was found. This was moderate in severity. Biopsied. - The examination was otherwise normal on direct and retroflexion views. - The examined portion of the ileum was normal.ov 2108 colonoscopy   Surgical [P], cecum near appendix - [place]. - NEGATIVE FOR DYSPLASIA. - SEE MICROSCOPIC DESCRIPTION. 2. Surgical [P], right and left colon - COLONIC MUCOSA WITH A RARE FOCUS OF MINIMAL  ACTIVE INFLAMMATION. - NEGATIVE FOR DYSPLASIA. - SEE MICROSCOPIC DESCRIPTION. 3. Surgical [P], rectum and sigmoid - MODERATELY ACTIVE COLITIS WITH FOCAL EROSION. - NEGATIVE FOR DYSPLASIA. - SEE MICROSCOPIC DESCRIPTION.   Past Medical History:  Diagnosis Date  . Allergy    seasonal  . Anemia   . Arthritis   . Barrett esophagus   . Blood transfusion abn reaction or complication, no procedure mishap 1968   after kidney surgery  . Blood transfusion without reported diagnosis   . DJD (degenerative joint disease)    fingers and ankle  . GERD (gastroesophageal reflux disease)   . H. pylori infection   . Heart murmur   . History of kidney stones   . Hypothyroidism   . Left sided ulcerative (chronic) colitis ([place]) 02/08/2019  . Obesity   . Onychomycosis   . Overactive bladder   . Plantar fasciitis   . RBBB   . Reflux   . Ulcer     Past Surgical History:  Procedure Laterality Date  . CHOLECYSTECTOMY  2002  . COLONOSCOPY    . ESOPHAGOGASTRODUODENOSCOPY (EGD) WITH PROPOFOL N/A 01/17/2017   Procedure: ESOPHAGOGASTRODUODENOSCOPY (EGD) WITH PROPOFOL;  Surgeon: Johnathan Hausen, MD;  Location: [facility];  Service: General;  Laterality: N/A;  . KIDNEY SURGERY  1966  . LAPAROSCOPIC GASTRIC BANDING  08/07/2007   repair hiatal hernia, Dr Hassell Done  . UPPER GASTROINTESTINAL ENDOSCOPY      Prior to Admission medications   Medication Sig Start Date End Date Taking? Authorizing Provider  aspirin 81 MG tablet Take 81 mg by mouth daily.   Yes [provider]  cetirizine (ZYRTEC) 10 MG tablet Take 10 mg by mouth daily.   Yes [provider]  escitalopram (LEXAPRO) 10 MG tablet Take 10 mg by mouth daily. 03/17/19  Yes [provider]  levothyroxine (SYNTHROID, LEVOTHROID) 50 MCG tablet Take 50 mcg by mouth daily before breakfast.   Yes [provider]  loperamide (IMODIUM A-D) 2 MG tablet Take 1 mg by mouth 2 (two) times daily as needed for diarrhea or loose  stools.   Yes [provider]  mesalamine (LIALDA) 1.2 g EC tablet TAKE 2 TABLETS BY MOUTH TWICE DAILY WITH A MEAL Patient taking differently: Take 2.4 g by mouth 2 (two) times daily with a meal.  03/25/19  Yes Gatha Mayer, MD  Multiple Vitamin (MULTIVITAMIN) capsule Take 1 capsule by mouth daily.     Yes [provider]  omeprazole (PRILOSEC) 40 MG capsule Take 40 mg by mouth daily.    Yes [provider]  predniSONE (DELTASONE) 50 MG tablet Take one tablet 13 hours prior to CT, take one tablet 7 hours prior to CT, take one tablet one hour prior to CT . With last dose also take one 50 over the counter benadryl Patient not taking: Reported on 05/02/2019 03/25/19   Gatha Mayer, MD    Current Facility-Administered Medications  Medication Dose Route Frequency Provider Last Rate Last Dose  . acetaminophen (TYLENOL) tablet 650 mg  650 mg Oral Q6H PRN Hugelmeyer, Alexis,  DO   650 mg at 05/03/19 0014   Or  . acetaminophen (TYLENOL) suppository 650 mg  650 mg Rectal Q6H PRN Hugelmeyer, Alexis, DO      . albuterol (PROVENTIL) (2.5 MG/3ML) 0.083% nebulizer solution 2.5 mg  2.5 mg Nebulization Q6H PRN Hugelmeyer, Alexis, DO      . cefTRIAXone (ROCEPHIN) 1 g in sodium chloride 0.9 % 100 mL IVPB  1 g Intravenous Q24H Hugelmeyer, Alexis, DO      . ipratropium (ATROVENT) nebulizer solution 0.5 mg  0.5 mg Nebulization Q6H PRN Hugelmeyer, Alexis, DO      . lactated ringers infusion   Intravenous Continuous Lavina Hamman, MD 100 mL/hr at 05/03/19 0831    . metroNIDAZOLE (FLAGYL) IVPB 500 mg  500 mg Intravenous Q8H Hugelmeyer, Alexis, DO 100 mL/hr at 05/03/19 0832 500 mg at 05/03/19 0832  . morphine 2 MG/ML injection 2-4 mg  2-4 mg Intravenous Q3H PRN Lavina Hamman, MD      . ondansetron [facility]) tablet 4 mg  4 mg Oral Q6H PRN Hugelmeyer, Alexis, DO       Or  . ondansetron (ZOFRAN) injection 4 mg  4 mg Intravenous Q6H PRN Hugelmeyer, Alexis, DO   4 mg at 05/03/19 0015  .  oxyCODONE (Oxy IR/ROXICODONE) immediate release tablet 5 mg  5 mg Oral Q4H PRN Hugelmeyer, Alexis, DO      . potassium chloride 10 mEq in 100 mL IVPB  10 mEq Intravenous Q1 Hr x 4 Lavina Hamman, MD 100 mL/hr at 05/03/19 0950 10 mEq at 05/03/19 0950  . potassium chloride SA (K-DUR) CR tablet 40 mEq  40 mEq Oral Once Lavina Hamman, MD      . promethazine (PHENERGAN) injection 12.5 mg  12.5 mg Intravenous Q6H PRN Lavina Hamman, MD        Allergies as of 05/02/2019 - Review Complete 05/02/2019  Allergen Reaction Noted  . Sulfonamide derivatives Shortness Of Breath and Rash 11/25/2009  . Contrast media [iodinated diagnostic agents] Hives 03/19/2012  . Doxycycline Nausea And Vomiting 11/25/2009    Family History  Problem Relation Age of Onset  . Melanoma Brother   . Pancreatic cancer Brother 69       in liver and stomach, ? pancreatic main source  . GI problems Mother        polyps, diverticulosis  . Peripheral vascular disease Mother   . Hyperlipidemia Mother   . Hiatal hernia Mother   . Coronary artery disease Mother   . Other Mother        DJD  . GER disease Father   . Hyperlipidemia Father   . Other Father        DJD  . Transient ischemic attack Father        ocular  . Pancreatic cancer Maternal Grandfather   . Pancreatic cancer Paternal Grandfather   . Breast cancer Other   . Breast cancer Other   . Colon cancer Neg Hx   . Esophageal cancer Neg Hx   . Esophageal varices Neg Hx     Social History   Socioeconomic History  . Marital status: Married    Spouse name: Richard  . Number of children: 0  . Years of education: 31  . Highest education level: Not on file  Occupational History  . Occupation: Technical sales engineer: [employer]  Social Needs  . Financial resource strain: Not on file  . Food insecurity  Worry: Not on file    Inability: Not on file  . Transportation needs    Medical: Not on file    Non-medical: Not on file  Tobacco  Use  . Smoking status: Never Smoker  . Smokeless tobacco: Never Used  Substance and Sexual Activity  . Alcohol use: Yes    Comment: 1 -2 drinks per year  . Drug use: No  . Sexual activity: Not on file  Lifestyle  . Physical activity    Days per week: Not on file    Minutes per session: Not on file  . Stress: Not on file  Relationships  . Social Herbalist on phone: Not on file    Gets together: Not on file    Attends religious service: Not on file    Active member of club or organization: Not on file    Attends meetings of clubs or organizations: Not on file    Relationship status: Not on file  . Intimate partner violence    Fear of current or ex partner: Not on file    Emotionally abused: Not on file    Physically abused: Not on file    Forced sexual activity: Not on file  Other Topics Concern  . 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  Physical Exam: Vital signs in last 24 hours: Temp:  [98.4 F (36.9 C)-101.6 F (38.7 C)] 98.4 F (36.9 C) (06/19 0627) Pulse Rate:  [65-152] 85 (06/19 0627) Resp:  [17-26] 17 (06/19 0627) BP: (111-151)/(56-84) 151/74 (06/19 0627) SpO2:  [88 %-100 %] 94 % (06/19 0627) Weight:  [122.9 kg] 122.9 kg (06/18 1727) Last BM Date: 05/02/19 General:   Alert, well-developed,  female in NAD Psych:  Pleasant, cooperative. Normal mood and affect. Eyes:  Pupils equal, sclera clear, no icterus.   Conjunctiva pink. Ears:  Normal auditory acuity. Nose:  No deformity, discharge,  or lesions. Neck:  Supple; no masses Lungs:  Clear throughout to auscultation.   No wheezes, crackles, or rhonchi.  Heart:  Regular rate and rhythm; no murmurs, no lower extremity edema Abdomen:  Soft, non-distended, nontender, BS active, no palp mass    Rectal:  Deferred  Msk:   Symmetrical without gross deformities. . Neurologic:  Alert and  oriented x4;  grossly normal neurologically. Skin:  Intact without significant lesions or rashes.   Intake/Output from previous day: 06/18 0701 - 06/19 0700 In: 756.9 [P.O.:60; I.V.:496.9; IV Piggyback:200] Out: -  Intake/Output this shift: No intake/output data recorded.  Lab Results: Recent Labs    05/02/19 1919 05/03/19 0336  WBC 11.7* 15.0*  HGB 13.3 12.5  HCT 41.5 38.8  PLT 312 270   BMET Recent Labs    05/02/19 1919 05/03/19 0336  NA 141 139  K 3.0* 3.1*  CL 104 107  CO2 25 21*  GLUCOSE 134* 150*  BUN 6 9  CREATININE 0.91 0.89  CALCIUM 8.6* 8.0*   LFT Recent Labs    05/02/19 1919  PROT 6.9  ALBUMIN 3.5  AST 21  ALT 19  ALKPHOS 88  BILITOT 0.9   PT/INR No results for input(s): LABPROT, INR in the last 72 hours. Hepatitis Panel No results for input(s): HEPBSAG, HCVAB, HEPAIGM, HEPBIGM in the last 72 hours.   Marland Kitchen  CBC Latest Ref Rng & Units 05/03/2019 05/02/2019 03/22/2019  WBC 4.0 - 10.5 K/uL 15.0(H) 11.7(H) 9.1  Hemoglobin 12.0 - 15.0 g/dL 12.5 13.3 14.3  Hematocrit 36.0 - 46.0 % 38.8 41.5 42.6  Platelets 150 - 400 K/uL 270 312 285.0    . CMP Latest Ref Rng & Units 05/03/2019 05/02/2019 03/22/2019  Glucose 70 - 99 mg/dL 150(H) 134(H) 96  BUN 6 - 20 mg/dL 9 6 12   Creatinine 0.44 - 1.00 mg/dL 0.89 0.91 0.97  Sodium 135 - 145 mmol/L 139 141 138  Potassium 3.5 - 5.1 mmol/L 3.1(L) 3.0(L) 4.0  Chloride 98 - 111 mmol/L 107 104 101  CO2 22 - 32 mmol/L 21(L) 25 27  Calcium 8.9 - 10.3 mg/dL 8.0(L) 8.6(L) 9.6  Total Protein 6.5 - 8.1 g/dL - 6.9 7.5  Total Bilirubin 0.3 - 1.2 mg/dL - 0.9 1.2  Alkaline Phos 38 - 126 U/L - 88 95  AST 15 - 41 U/L - 21 19  ALT 0 - 44 U/L - 19 19   Studies/Results: Dg Abdomen 1 View  Result Date: 05/02/2019 CLINICAL DATA:  Diarrhea with abdominal bloating. EXAM: ABDOMEN - 1 VIEW COMPARISON:  CT scan 03/28/2019 FINDINGS: Supine view the abdomen shows no gaseous  bowel dilatation to suggest obstruction. Lap band has a vertical orientation, stable since CT scan and slightly more vertical than on 8 KUB from 11/03/2016. Surgical clips right upper quadrant suggest prior cholecystectomy. Visualized bony anatomy unremarkable. IMPRESSION: No evidence for bowel obstruction. Vertical orientation of the lap band is similar to the recent CT scout image but appears to have become more vertical comparing to the KUB from 11/03/2016. Electronically Signed   By: Misty Stanley M.D.   On: 05/02/2019 20:50   Dg Chest Portable 1 View  Result Date: 05/02/2019 CLINICAL DATA:  Shortness of breath EXAM: PORTABLE CHEST 1 VIEW COMPARISON:  08/03/2007 FINDINGS: Mild cardiomegaly. No focal opacity or pleural effusion. No pneumothorax. IMPRESSION: No active disease.  Cardiomegaly Electronically Signed   By: Donavan Foil M.D.   On: 05/02/2019 19:18     Tye Savoy, NP-C @  05/03/2019, 10:09 AM

## 2019-05-03 NOTE — Anesthesia Preprocedure Evaluation (Signed)
Anesthesia Evaluation  Patient identified by MRN, date of birth, ID band Patient awake    Reviewed: Allergy & Precautions, NPO status , Patient's Chart, lab work & pertinent test results  Airway Mallampati: III  TM Distance: >3 FB     Dental  (+) Dental Advisory Given   Pulmonary neg pulmonary ROS,    breath sounds clear to auscultation       Cardiovascular + dysrhythmias  Rhythm:Regular Rate:Normal     Neuro/Psych negative neurological ROS     GI/Hepatic Neg liver ROS, PUD, GERD  ,UC S/p gastric banding    Endo/Other  Hypothyroidism Morbid obesity  Renal/GU negative Renal ROS     Musculoskeletal  (+) Arthritis ,   Abdominal   Peds  Hematology negative hematology ROS (+)   Anesthesia Other Findings   Reproductive/Obstetrics                             Lab Results  Component Value Date   WBC 15.0 (H) 05/03/2019   HGB 12.5 05/03/2019   HCT 38.8 05/03/2019   MCV 90.2 05/03/2019   PLT 270 05/03/2019   Lab Results  Component Value Date   CREATININE 0.89 05/03/2019   BUN 9 05/03/2019   NA 139 05/03/2019   K 3.1 (L) 05/03/2019   CL 107 05/03/2019   CO2 21 (L) 05/03/2019    Anesthesia Physical Anesthesia Plan  ASA: III  Anesthesia Plan: General   Post-op Pain Management:    Induction: Intravenous and Rapid sequence  PONV Risk Score and Plan: 3 and Dexamethasone, Ondansetron and Treatment may vary due to age or medical condition  Airway Management Planned: Oral ETT  Additional Equipment:   Intra-op Plan:   Post-operative Plan:   Informed Consent: I have reviewed the patients History and Physical, chart, labs and discussed the procedure including the risks, benefits and alternatives for the proposed anesthesia with the patient or authorized representative who has indicated his/her understanding and acceptance.     Dental advisory given  Plan Discussed with:  CRNA  Anesthesia Plan Comments:         Anesthesia Quick Evaluation

## 2019-05-03 NOTE — Anesthesia Procedure Notes (Signed)
Procedure Name: Intubation Date/Time: 05/03/2019 6:07 PM Performed by: Anne Fu, CRNA Pre-anesthesia Checklist: Patient identified, Emergency Drugs available, Suction available, Patient being monitored and Timeout performed Patient Re-evaluated:Patient Re-evaluated prior to induction Oxygen Delivery Method: Circle system utilized Preoxygenation: Pre-oxygenation with 100% oxygen Induction Type: IV induction Ventilation: Mask ventilation without difficulty Laryngoscope Size: Mac and 4 Grade View: Grade I Tube type: Oral Tube size: 7.5 mm Number of attempts: 1 Airway Equipment and Method: Stylet Placement Confirmation: ETT inserted through vocal cords under direct vision,  positive ETCO2 and breath sounds checked- equal and bilateral Secured at: 21 cm Tube secured with: Tape Dental Injury: Teeth and Oropharynx as per pre-operative assessment

## 2019-05-03 NOTE — Progress Notes (Signed)
PHARMACY - PHYSICIAN COMMUNICATION CRITICAL VALUE ALERT - BLOOD CULTURE IDENTIFICATION (BCID)  Joyce Kim is an 58 y.o. female who presented to [facility] on 05/02/2019 with a chief complaint of fever, generalized weakness, N/V/D.  Assessment:  Pt has a hx ulcerative colitis & recent kidney stone.  She is currently being treated for pyelonephritis and ?Cdiff (antigen+/toxin-, PCR pending).  Name of physician (or Provider) Contacted: Posey Pronto  Current antibiotics: Rocephin 1gm IV q24h + Flagyl IV  Changes to prescribed antibiotics recommended:  Increase Rocephin 2gm IV q24h   Results for orders placed or performed during the hospital encounter of 05/02/19  Blood Culture ID Panel (Reflexed) (Collected: 05/02/2019  6:58 PM)  Result Value Ref Range   Enterococcus species NOT DETECTED NOT DETECTED   Listeria monocytogenes NOT DETECTED NOT DETECTED   Staphylococcus species NOT DETECTED NOT DETECTED   Staphylococcus aureus (BCID) NOT DETECTED NOT DETECTED   Streptococcus species NOT DETECTED NOT DETECTED   Streptococcus agalactiae NOT DETECTED NOT DETECTED   Streptococcus pneumoniae NOT DETECTED NOT DETECTED   Streptococcus pyogenes NOT DETECTED NOT DETECTED   Acinetobacter baumannii NOT DETECTED NOT DETECTED   Enterobacteriaceae species DETECTED (A) NOT DETECTED   Enterobacter cloacae complex NOT DETECTED NOT DETECTED   Escherichia coli NOT DETECTED NOT DETECTED   Klebsiella oxytoca NOT DETECTED NOT DETECTED   Klebsiella pneumoniae NOT DETECTED NOT DETECTED   Proteus species DETECTED (A) NOT DETECTED   Serratia marcescens NOT DETECTED NOT DETECTED   Carbapenem resistance NOT DETECTED NOT DETECTED   Haemophilus influenzae NOT DETECTED NOT DETECTED   Neisseria meningitidis NOT DETECTED NOT DETECTED   Pseudomonas aeruginosa NOT DETECTED NOT DETECTED   Candida albicans NOT DETECTED NOT DETECTED   Candida glabrata NOT DETECTED NOT DETECTED   Candida krusei NOT DETECTED NOT  DETECTED   Candida parapsilosis NOT DETECTED NOT DETECTED   Candida tropicalis NOT DETECTED NOT DETECTED    Biagio Borg 05/03/2019  1:17 PM

## 2019-05-03 NOTE — Op Note (Signed)
.  Preoperative diagnosis: right UPJ stone, sepsis  Postoperative diagnosis: Same  Procedure: 1 cystoscopy 2. right retrograde pyelography 3.  Intraoperative fluoroscopy, under one hour, with interpretation 4. right 6 x 26 JJ stent placement  Attending: Nicolette Bang  Anesthesia: General  Estimated blood loss: None  Drains: Right 6 x 26 JJ ureteral stent without tether, 16 French foley catheter  Specimens: none  Antibiotics: rocephin  Findings:right UPJ stone. Moderate hydronephrosis. No masses/lesions in the bladder. Ureteral orifices in normal anatomic location.  Indications: Patient is a 58 year old female with a history of a right UPJ stone and concern for sepsis.  After discussing treatment options, they decided proceed with right stent placement.  Procedure her in detail: The patient was brought to the operating room and a brief timeout was done to ensure correct patient, correct procedure, correct site.  General anesthesia was administered patient was placed in dorsal lithotomy position.  Their genitalia was then prepped and draped in usual sterile fashion.  A rigid 55 French cystoscope was passed in the urethra and the bladder.  Bladder was inspected free masses or lesions.  the ureteral orifices were in the normal orthotopic locations.  a 6 french ureteral catheter was then instilled into the right ureteral orifice.  a gentle retrograde was obtained and findings noted above.  we then placed a zip wire through the ureteral catheter and advanced up to the renal pelvis.    We then placed a 6 x 26 double-j ureteral stent over the original zip wire.  We then removed the wire and good coil was noted in the the renal pelvis under fluoroscopy and the bladder under direct vision.  A foley catheter was then placed. the bladder was then drained and this concluded the procedure which was well tolerated by patient.  Complications: None  Condition: Stable, extubated, transferred to  PACU  Plan: Patient is to be admitted for IV antibiotics. She will have her stone extraction in 2 weeks.

## 2019-05-04 ENCOUNTER — Encounter (HOSPITAL_COMMUNITY): Payer: Self-pay | Admitting: Urology

## 2019-05-04 LAB — COMPREHENSIVE METABOLIC PANEL
ALT: 23 U/L (ref 0–44)
AST: 28 U/L (ref 15–41)
Albumin: 2.9 g/dL — ABNORMAL LOW (ref 3.5–5.0)
Alkaline Phosphatase: 71 U/L (ref 38–126)
Anion gap: 11 (ref 5–15)
BUN: 14 mg/dL (ref 6–20)
CO2: 23 mmol/L (ref 22–32)
Calcium: 8.3 mg/dL — ABNORMAL LOW (ref 8.9–10.3)
Chloride: 105 mmol/L (ref 98–111)
Creatinine, Ser: 0.86 mg/dL (ref 0.44–1.00)
GFR calc Af Amer: >60 mL/min (ref >60)
GFR calc non Af Amer: >60 mL/min (ref >60)
Glucose, Bld: 169 mg/dL — ABNORMAL HIGH (ref 70–99)
Potassium: 3.8 mmol/L (ref 3.5–5.1)
Sodium: 139 mmol/L (ref 135–145)
Total Bilirubin: 0.8 mg/dL (ref 0.3–1.2)
Total Protein: 6.1 g/dL — ABNORMAL LOW (ref 6.5–8.1)

## 2019-05-04 LAB — CBC WITH DIFFERENTIAL/PLATELET
Abs Immature Granulocytes: 0.07 10*3/uL (ref 0.00–0.07)
Basophils Absolute: 0 10*3/uL (ref 0.0–0.1)
Basophils Relative: 0 %
Eosinophils Absolute: 0 10*3/uL (ref 0.0–0.5)
Eosinophils Relative: 0 %
HCT: 38.4 % (ref 36.0–46.0)
Hemoglobin: 11.8 g/dL — ABNORMAL LOW (ref 12.0–15.0)
Immature Granulocytes: 1 %
Lymphocytes Relative: 11 %
Lymphs Abs: 1.1 10*3/uL (ref 0.7–4.0)
MCH: 28.2 pg (ref 26.0–34.0)
MCHC: 30.7 g/dL (ref 30.0–36.0)
MCV: 91.9 fL (ref 80.0–100.0)
Monocytes Absolute: 0.2 10*3/uL (ref 0.1–1.0)
Monocytes Relative: 2 %
Neutro Abs: 8.3 10*3/uL — ABNORMAL HIGH (ref 1.7–7.7)
Neutrophils Relative %: 86 %
Platelets: 242 10*3/uL (ref 150–400)
RBC: 4.18 MIL/uL (ref 3.87–5.11)
RDW: 14.8 % (ref 11.5–15.5)
WBC: 9.7 10*3/uL (ref 4.0–10.5)
nRBC: 0 % (ref 0.0–0.2)

## 2019-05-04 LAB — CLOSTRIDIUM DIFFICILE BY PCR, REFLEXED: Toxigenic C. Difficile by PCR: POSITIVE — AB

## 2019-05-04 LAB — HIV ANTIBODY (ROUTINE TESTING W REFLEX): HIV Screen 4th Generation wRfx: NONREACTIVE

## 2019-05-04 LAB — MAGNESIUM: Magnesium: 2 mg/dL (ref 1.7–2.4)

## 2019-05-04 MED ORDER — FAMOTIDINE 20 MG PO TABS
20.0000 mg | ORAL_TABLET | Freq: Every day | ORAL | Status: DC
  Administered 2019-05-04 – 2019-05-06 (×3): 20 mg via ORAL
  Filled 2019-05-04 (×3): qty 1

## 2019-05-04 MED ORDER — VANCOMYCIN 50 MG/ML ORAL SOLUTION
125.0000 mg | Freq: Four times a day (QID) | ORAL | Status: DC
  Administered 2019-05-04 – 2019-05-08 (×17): 125 mg via ORAL
  Filled 2019-05-04 (×20): qty 2.5

## 2019-05-04 NOTE — Progress Notes (Signed)
[facility] Progress Note  Patient: Joyce Kim MWU:[id number]   PCP: Prince Solian, MD DOB: [birth year]   DOA: 05/02/2019   DOS: 05/04/2019   Date of Service: the patient was seen and examined on 05/04/2019  Brief hospital course: Pt. with PMH of ulcerative colitis, GERD, hypothyroidism, recent right-sided kidney stone with hydronephrosis; admitted on 05/02/2019, presented with complaint of abdominal pain, fever, diarrhea and generalized body ache, was found to have ulcerative colitis flareup,?  C. difficile, pyelonephritis. Currently further plan is continue current antibiotic, follow-up GI recommendation, will require abdominal imaging.  Subjective:Feeling better, still has diarrhea.  No nausea no vomiting.  Oral intake improving.  Assessment and Plan: 1.  Acute on chronic exacerbation of ulcerative colitis. C. difficile colitis C. difficile antigen positive, PCR positive, toxin negative. Stopping IV Flagyl and transition to oral vancomycin GI consulted. Continue IV Rocephin as well.  2.  Acute pyonephritis. Sepsis secondary to Proteus bacteremia Right ureteral stone with mild hydroureteronephrosis Left renal stone. Patient has seen Dr. Karsten Ro family or urology. Reports that this pain is different than her kidney stone pain. She actually reports that she has passed the kidney stone. Urine does have evidence of pyuria.  CT scan was concerning for right ureteric stone with hydroureteronephrosis.  Patient underwent right ureteric stent placement.  Now significantly better. Continue with IV antibiotics. Blood cultures positive for Proteus.  Sensitivities pending.  3.  Hiatal hernia. History of lap banding GERD Patient had prior history of large lateral hernia repaired with bio mesh and lap band in 2008. CT on 03/28/2019 shows lap band positioning vertically. Unsure whether this is associated with the patient's abdominal pain. We will follow-up on GI  recommendation. Procedure was performed by Dr. Hassell Done general surgery.  4.  Hypokalemia. Replacing IV.  5.  Hypothyroidism. Continue Synthroid. May require IV Synthroid if patient is unable to tolerate p.o.  6. Mood disorder. On Lexapro Currently on hold.  7.  Morbid obesity Body mass index is 43.74 kg/m.  S/P lap band. Dietary consultation.  Diet: Clear liquid diet DVT Prophylaxis: Subcutaneous Lovenox  Advance goals of care discussion: Full code  Family Communication: no family was present at bedside, at the time of interview.   Disposition:  Discharge to Home 2 to 3 days.  Consultants: gastroenterology  Procedures: none  Scheduled Meds: . dicyclomine  10 mg Oral TID AC & HS  . enoxaparin (LOVENOX) injection  40 mg Subcutaneous Q24H  . famotidine  20 mg Oral Daily  . vancomycin  125 mg Oral QID   Continuous Infusions: . cefTRIAXone (ROCEPHIN)  IV 2 g (05/04/19 1402)  . lactated ringers 100 mL/hr at 05/04/19 1007   PRN Meds: acetaminophen **OR** acetaminophen, albuterol, ipratropium, morphine injection, ondansetron **OR** ondansetron (ZOFRAN) IV, oxyCODONE-acetaminophen, promethazine Antibiotics: Anti-infectives (From admission, onward)   Start     Dose/Rate Route Frequency Ordered Stop   05/04/19 1445  vancomycin (VANCOCIN) 50 mg/mL oral solution 125 mg     125 mg Oral 4 times daily 05/04/19 1433 05/14/19 1359   05/03/19 2200  cefTRIAXone (ROCEPHIN) 1 g in sodium chloride 0.9 % 100 mL IVPB  Status:  Discontinued     1 g 200 mL/hr over 30 Minutes Intravenous Every 24 hours 05/02/19 2341 05/03/19 1326   05/03/19 1400  cefTRIAXone (ROCEPHIN) 2 g in sodium chloride 0.9 % 100 mL IVPB     2 g 200 mL/hr over 30 Minutes Intravenous Every 24 hours 05/03/19 1326     05/03/19 0015  metroNIDAZOLE (FLAGYL) IVPB 500 mg  Status:  Discontinued     500 mg 100 mL/hr over 60 Minutes Intravenous Every 8 hours 05/03/19 0011 05/04/19 1429   05/02/19 2145  cefTRIAXone (ROCEPHIN)  1 g in sodium chloride 0.9 % 100 mL IVPB     1 g 200 mL/hr over 30 Minutes Intravenous  Once 05/02/19 2135 05/02/19 2236       Objective: Physical Exam: Vitals:   05/03/19 2323 05/04/19 0205 05/04/19 0447 05/04/19 1331  BP: 114/61 (!) 111/57 122/71 129/71  Pulse: (!) 53 (!) 50 (!) 48 (!) 56  Resp: 14 16 16 16   Temp: 97.7 F (36.5 C) 98.4 F (36.9 C) 97.7 F (36.5 C)   TempSrc: Oral Oral Oral   SpO2: 97% 95% 97% 98%  Weight:      Height:        Intake/Output Summary (Last 24 hours) at 05/04/2019 1841 Last data filed at 05/04/2019 1600 Gross per 24 hour  Intake 2795.91 ml  Output 950 ml  Net 1845.91 ml   Filed Weights   05/02/19 1727  Weight: 122.9 kg   General: alert and oriented to time, place, and person. Appear in moderate distress, affect appropriate Eyes: PERRL, Conjunctiva normal ENT: Oral Mucosa Clear, dry  Neck: no JVD, no Abnormal Mass Or lumps Cardiovascular: S1 and S2 Present, no Murmur, peripheral pulses symmetrical Respiratory: normal respiratory effort, Bilateral Air entry equal and Decreased, no use of accessory muscle, Clear to Auscultation, no Crackles, no wheezes Abdomen: Bowel Sound present, Soft and mild diffuse tenderness, no hernia Skin: no rashes  Extremities: no Pedal edema, no calf tenderness Neurologic: normal without focal findings, mental status, speech normal, alert and oriented x3, PERLA, Motor strength 5/5 and symmetric and sensation grossly normal to light touch Gait not checked due to patient safety concerns  Data Reviewed: CBC: Recent Labs  Lab 05/02/19 1919 05/03/19 0336 05/04/19 0443  WBC 11.7* 15.0* 9.7  NEUTROABS 9.4*  --  8.3*  HGB 13.3 12.5 11.8*  HCT 41.5 38.8 38.4  MCV 89.6 90.2 91.9  PLT 312 270 048   Basic Metabolic Panel: Recent Labs  Lab 05/02/19 1919 05/03/19 0029 05/03/19 0336 05/04/19 0443  NA 141  --  139 139  K 3.0*  --  3.1* 3.8  CL 104  --  107 105  CO2 25  --  21* 23  GLUCOSE 134*  --  150* 169*   BUN 6  --  9 14  CREATININE 0.91  --  0.89 0.86  CALCIUM 8.6*  --  8.0* 8.3*  MG 1.8 1.9  --  2.0  PHOS  --  3.3  --   --     Liver Function Tests: Recent Labs  Lab 05/02/19 1919 05/04/19 0443  AST 21 28  ALT 19 23  ALKPHOS 88 71  BILITOT 0.9 0.8  PROT 6.9 6.1*  ALBUMIN 3.5 2.9*   No results for input(s): LIPASE, AMYLASE in the last 168 hours. No results for input(s): AMMONIA in the last 168 hours. Coagulation Profile: No results for input(s): INR, PROTIME in the last 168 hours. Cardiac Enzymes: No results for input(s): CKTOTAL, CKMB, CKMBINDEX, TROPONINI in the last 168 hours. BNP (last 3 results) No results for input(s): PROBNP in the last 8760 hours. CBG: Recent Labs  Lab 05/03/19 1018  GLUCAP 126*   Studies: No results found.   Time spent: 35 minutes  Author: Berle Mull, MD [facility] 05/04/2019 6:41 PM  To reach  On-call, see care teams to locate the attending and reach out to them via [email or web address]. If 7PM-7AM, please contact night-coverage If you still have difficulty reaching the attending provider, please page the [facility] (Director on Call) for [facility] on amion for assistance.

## 2019-05-04 NOTE — Progress Notes (Signed)
MEDICATION RELATED CONSULT NOTE - INITIAL   Pharmacy Consult for 1st occurrence of + CDiff Indication: + CDiff  Allergies  Allergen Reactions  . Sulfonamide Derivatives Shortness Of Breath and Rash  . Contrast Media [Iodinated Diagnostic Agents] Hives    Patient takes Benadryl when getting the diagnostic agents, seems to be fine after.  . Doxycycline Nausea And Vomiting    Patient Measurements: Height: 5' 6"  (167.6 cm) Weight: 271 lb (122.9 kg) IBW/kg (Calculated) : 59.3  Vital Signs: Temp: 97.7 F (36.5 C) (06/20 0447) Temp Source: Oral (06/20 0447) BP: 129/71 (06/20 1331) Pulse Rate: 56 (06/20 1331) Intake/Output from previous day: 06/19 0701 - 06/20 0700 In: 3345.4 [P.O.:240; I.V.:2527.2; IV Piggyback:578.2] Out: 650 [Urine:650] Intake/Output from this shift: Total I/O In: 399.9 [I.V.:299.9; IV Piggyback:100] Out: -   Labs: Recent Labs    05/02/19 1919 05/03/19 0029 05/03/19 0336 05/04/19 0443  WBC 11.7*  --  15.0* 9.7  HGB 13.3  --  12.5 11.8*  HCT 41.5  --  38.8 38.4  PLT 312  --  270 242  CREATININE 0.91  --  0.89 0.86  MG 1.8 1.9  --  2.0  PHOS  --  3.3  --   --   ALBUMIN 3.5  --   --  2.9*  PROT 6.9  --   --  6.1*  AST 21  --   --  28  ALT 19  --   --  23  ALKPHOS 88  --   --  71  BILITOT 0.9  --   --  0.8   Estimated Creatinine Clearance: 96.5 mL/min (by C-G formula based on SCr of 0.86 mg/dL).   Microbiology: Recent Results (from the past 720 hour(s))  Urine culture     Status: Abnormal (Preliminary result)   Collection Time: 05/02/19  6:14 PM   Specimen: Urine, Clean Catch  Result Value Ref Range Status   Specimen Description   Final    URINE, CLEAN CATCH Performed at [facility], [place] [address], [place], [place] [address]    Special Requests   Final    NONE Performed at [facility], [place] [address], [place], [place] [address]    Culture (A)  Final    >=100,000 COLONIES/mL PROTEUS  MIRABILIS REPEATING SUSCEPTIBILITIES Performed at [facility], [place] [address], [place],  [address]    Report Status PENDING  Incomplete  SARS Coronavirus 2 (CEPHEID- Performed in [place] hospital lab), Hosp Order     Status: None   Collection Time: 05/02/19  6:36 PM   Specimen: Nasopharyngeal Swab  Result Value Ref Range Status   SARS Coronavirus 2 NEGATIVE NEGATIVE Final    Comment: (NOTE) If result is NEGATIVE SARS-CoV-2 target nucleic acids are NOT DETECTED. The SARS-CoV-2 RNA is generally detectable in upper and lower  respiratory specimens during the acute phase of infection. The lowest  concentration of SARS-CoV-2 viral copies this assay can detect is 250  copies / mL. A negative result does not preclude SARS-CoV-2 infection  and should not be used as the sole basis for treatment or other  patient management decisions.  A negative result may occur with  improper specimen collection / handling, submission of specimen other  than nasopharyngeal swab, presence of viral mutation(s) within the  areas targeted by this assay, and inadequate number of viral copies  (<250 copies / mL). A negative result must be combined with clinical  observations, patient history,  and epidemiological information. If result is POSITIVE SARS-CoV-2 target nucleic acids are DETECTED. The SARS-CoV-2 RNA is generally detectable in upper and lower  respiratory specimens dur ing the acute phase of infection.  Positive  results are indicative of active infection with SARS-CoV-2.  Clinical  correlation with patient history and other diagnostic information is  necessary to determine patient infection status.  Positive results do  not rule out bacterial infection or co-infection with other viruses. If result is PRESUMPTIVE POSTIVE SARS-CoV-2 nucleic acids MAY BE PRESENT.   A presumptive positive result was obtained on the submitted specimen  and confirmed on repeat testing.  While 2019 novel  coronavirus  (SARS-CoV-2) nucleic acids may be present in the submitted sample  additional confirmatory testing may be necessary for epidemiological  and / or clinical management purposes  to differentiate between  SARS-CoV-2 and other Sarbecovirus currently known to infect humans.  If clinically indicated additional testing with an alternate test  methodology [phone]) is advised. The SARS-CoV-2 RNA is generally  detectable in upper and lower respiratory sp ecimens during the acute  phase of infection. The expected result is Negative. Fact Sheet for Patients:  StrictlyIdeas.no Fact Sheet for Healthcare Providers: BankingDealers.co.za This test is not yet approved or cleared by the [place] FDA and has been authorized for detection and/or diagnosis of SARS-CoV-2 by FDA under an Emergency Use Authorization (EUA).  This EUA will remain in effect (meaning this test can be used) for the duration of the COVID-19 declaration under Section 564(b)(1) of the Act, 21 U.S.C. section 360bbb-3(b)(1), unless the authorization is terminated or revoked sooner. Performed at [facility], [place] [address], [place], [place] [address]   Gastrointestinal Panel by PCR , Stool     Status: None   Collection Time: 05/02/19  6:37 PM   Specimen: Stool  Result Value Ref Range Status   Campylobacter species NOT DETECTED NOT DETECTED Final   Plesimonas shigelloides NOT DETECTED NOT DETECTED Final   Salmonella species NOT DETECTED NOT DETECTED Final   Yersinia enterocolitica NOT DETECTED NOT DETECTED Final   Vibrio species NOT DETECTED NOT DETECTED Final   Vibrio cholerae NOT DETECTED NOT DETECTED Final   Enteroaggregative E coli (EAEC) NOT DETECTED NOT DETECTED Final   Enteropathogenic E coli (EPEC) NOT DETECTED NOT DETECTED Final   Enterotoxigenic E coli (ETEC) NOT DETECTED NOT DETECTED Final   Shiga like toxin producing E coli (STEC) NOT  DETECTED NOT DETECTED Final   Shigella/Enteroinvasive E coli (EIEC) NOT DETECTED NOT DETECTED Final   Cryptosporidium NOT DETECTED NOT DETECTED Final   Cyclospora cayetanensis NOT DETECTED NOT DETECTED Final   Entamoeba histolytica NOT DETECTED NOT DETECTED Final   Giardia lamblia NOT DETECTED NOT DETECTED Final   Adenovirus F40/41 NOT DETECTED NOT DETECTED Final   Astrovirus NOT DETECTED NOT DETECTED Final   Norovirus GI/GII NOT DETECTED NOT DETECTED Final   Rotavirus A NOT DETECTED NOT DETECTED Final   Sapovirus (I, II, IV, and V) NOT DETECTED NOT DETECTED Final    Comment: Performed at [facility], [place] [place], [place] [address]  Culture, blood (routine x 2)     Status: Abnormal (Preliminary result)   Collection Time: 05/02/19  6:58 PM   Specimen: BLOOD  Result Value Ref Range Status   Specimen Description   Final    BLOOD LEFT ANTECUBITAL Performed at [facility], [place] [address] [place], [place] [address]    Special Requests   Final  BOTTLES DRAWN AEROBIC AND ANAEROBIC Blood Culture adequate volume Performed at [place] [address] [place], [place] [address]    Culture  Setup Time   Final    GRAM NEGATIVE RODS IN BOTH AEROBIC AND ANAEROBIC BOTTLES CRITICAL RESULT CALLED TO, READ BACK BY AND VERIFIED WITH: Chales Abrahams PharmD 13:15 05/03/19 ([email or web address]) Performed at [facility], [place] [address] [place], [place] [address]    Culture PROTEUS MIRABILIS (A)  Final   Report Status PENDING  Incomplete  Blood Culture ID Panel (Reflexed)     Status: Abnormal   Collection Time: 05/02/19  6:58 PM  Result Value Ref Range Status   Enterococcus species NOT DETECTED NOT DETECTED Final   Listeria monocytogenes NOT DETECTED NOT DETECTED Final   Staphylococcus species NOT DETECTED NOT DETECTED Final   Staphylococcus aureus (BCID) NOT DETECTED NOT DETECTED Final   Streptococcus species NOT DETECTED NOT DETECTED Final    Streptococcus agalactiae NOT DETECTED NOT DETECTED Final   Streptococcus pneumoniae NOT DETECTED NOT DETECTED Final   Streptococcus pyogenes NOT DETECTED NOT DETECTED Final   Acinetobacter baumannii NOT DETECTED NOT DETECTED Final   Enterobacteriaceae species DETECTED (A) NOT DETECTED Final    Comment: Enterobacteriaceae represent a large family of gram-negative bacteria, not a single organism. CRITICAL RESULT CALLED TO, READ BACK BY AND VERIFIED WITH: Chales Abrahams PharmD 13:15 05/03/19 ([email or web address])    Enterobacter cloacae complex NOT DETECTED NOT DETECTED Final   Escherichia coli NOT DETECTED NOT DETECTED Final   Klebsiella oxytoca NOT DETECTED NOT DETECTED Final   Klebsiella pneumoniae NOT DETECTED NOT DETECTED Final   Proteus species DETECTED (A) NOT DETECTED Final    Comment: CRITICAL RESULT CALLED TO, READ BACK BY AND VERIFIED WITH: Chales Abrahams PharmD 13:15 05/03/19 ([email or web address])    Serratia marcescens NOT DETECTED NOT DETECTED Final   Carbapenem resistance NOT DETECTED NOT DETECTED Final   Haemophilus influenzae NOT DETECTED NOT DETECTED Final   Neisseria meningitidis NOT DETECTED NOT DETECTED Final   Pseudomonas aeruginosa NOT DETECTED NOT DETECTED Final   Candida albicans NOT DETECTED NOT DETECTED Final   Candida glabrata NOT DETECTED NOT DETECTED Final   Candida krusei NOT DETECTED NOT DETECTED Final   Candida parapsilosis NOT DETECTED NOT DETECTED Final   Candida tropicalis NOT DETECTED NOT DETECTED Final    Comment: Performed at [facility], [place] [address], [place], [place] [address]  Culture, blood (routine x 2)     Status: None (Preliminary result)   Collection Time: 05/02/19  7:05 PM   Specimen: BLOOD  Result Value Ref Range Status   Specimen Description   Final    BLOOD RIGHT ANTECUBITAL Performed at [place] [address], [place], [place] [address]    Special Requests   Final    BOTTLES DRAWN AEROBIC AND ANAEROBIC Blood Culture adequate  volume Performed at [place] [address], [place],  [address]    Culture  Setup Time   Final    GRAM NEGATIVE RODS ANAEROBIC BOTTLE ONLY CRITICAL VALUE NOTED.  VALUE IS CONSISTENT WITH PREVIOUSLY REPORTED AND CALLED VALUE. Performed at [facility], [place] [address] [place],  [address]    Culture GRAM NEGATIVE RODS  Final   Report Status PENDING  Incomplete  C difficile quick scan w PCR reflex     Status: Abnormal   Collection Time: 05/02/19 11:42 PM   Specimen: STOOL  Result Value Ref Range Status  C Diff antigen POSITIVE (A) NEGATIVE Final   C Diff toxin NEGATIVE NEGATIVE Final   C Diff interpretation Results are indeterminate. See PCR results.  Final    Comment: Performed at [facility], [place] [address], [place], [place] [address]  C. Diff by PCR, Reflexed     Status: Abnormal   Collection Time: 05/02/19 11:42 PM  Result Value Ref Range Status   Toxigenic C. Difficile by PCR POSITIVE (A) NEGATIVE Final    Comment: Positive for toxigenic C. difficile with little to no toxin production. Only treat if clinical presentation suggests symptomatic illness. Performed at [facility], [place] [address] [place], [place] [address]     Medical History: Past Medical History:  Diagnosis Date  . Allergy    seasonal  . Anemia   . Arthritis   . Barrett esophagus   . Blood transfusion abn reaction or complication, no procedure mishap 1968   after kidney surgery  . Blood transfusion without reported diagnosis   . DJD (degenerative joint disease)    fingers and ankle  . Dyspnea    vomiting  . GERD (gastroesophageal reflux disease)   . H. pylori infection   . Heart murmur   . History of kidney stones   . Hypothyroidism   . Left sided ulcerative (chronic) colitis ([place]) 02/08/2019  . Obesity   . Onychomycosis   . Overactive bladder   . Plantar fasciitis   . RBBB   . Reflux   . Ulcer      Assessment: 58 yr  female admitted with c/o fever, chills, nausea, vomiting and diarrhea.  PMH significant for ulcerative colitis.  Pt reports diarrhea worse than usual.  +CDiff = positive  Pharmacy consulted to assist with dosing of medication for first occurrence of +CDiff.  Goal of Therapy:  Treatment of infection  Plan:  Vancomycin 128m po QID x 10 days  Need for further dosage adjustment appears unlikely at present.    Will sign off at this time.  Please reconsult if a change in clinical status warrants re-evaluation of dosage.  Shaneese Tait, LToribio Harbour PharmD 05/04/2019,2:34 PM

## 2019-05-04 NOTE — Progress Notes (Signed)
1 Day Post-Op Subjective: Patient reports improvement in her flank pain. No nausea/vomiting. She had a fever of 102. No hematuria  Objective: Vital signs in last 24 hours: Temp:  [97.6 F (36.4 C)-102.8 F (39.3 C)] 97.7 F (36.5 C) (06/20 0447) Pulse Rate:  [48-88] 48 (06/20 0447) Resp:  [14-27] 16 (06/20 0447) BP: (104-144)/(55-75) 122/71 (06/20 0447) SpO2:  [91 %-100 %] 97 % (06/20 0447)  Intake/Output from previous day: 06/19 0701 - 06/20 0700 In: 3345.4 [P.O.:240; I.V.:2527.2; IV Piggyback:578.2] Out: 650 [Urine:650] Intake/Output this shift: No intake/output data recorded.  Physical Exam:  General:alert, cooperative and appears stated age GI: soft, non tender, normal bowel sounds, no palpable masses, no organomegaly, no inguinal hernia Female genitalia: not done Extremities: extremities normal, atraumatic, no cyanosis or edema  Lab Results: Recent Labs    05/02/19 1919 05/03/19 0336 05/04/19 0443  HGB 13.3 12.5 11.8*  HCT 41.5 38.8 38.4   BMET Recent Labs    05/03/19 0336 05/04/19 0443  NA 139 139  K 3.1* 3.8  CL 107 105  CO2 21* 23  GLUCOSE 150* 169*  BUN 9 14  CREATININE 0.89 0.86  CALCIUM 8.0* 8.3*   No results for input(s): LABPT, INR in the last 72 hours. No results for input(s): LABURIN in the last 72 hours. Results for orders placed or performed during the hospital encounter of 05/02/19  Urine culture     Status: Abnormal (Preliminary result)   Collection Time: 05/02/19  6:14 PM   Specimen: Urine, Clean Catch  Result Value Ref Range Status   Specimen Description   Final    URINE, CLEAN CATCH Performed at [facility], [place] [address], [place], [place] [address]    Special Requests   Final    NONE Performed at [facility], [place] [address], [place], [place] [address]    Culture (A)  Final    >=100,000 COLONIES/mL PROTEUS MIRABILIS REPEATING SUSCEPTIBILITIES Performed at [facility], [place] [address], [place],  [address]    Report Status PENDING  Incomplete  SARS Coronavirus 2 (CEPHEID- Performed in [place] hospital lab), Hosp Order     Status: None   Collection Time: 05/02/19  6:36 PM   Specimen: Nasopharyngeal Swab  Result Value Ref Range Status   SARS Coronavirus 2 NEGATIVE NEGATIVE Final    Comment: (NOTE) If result is NEGATIVE SARS-CoV-2 target nucleic acids are NOT DETECTED. The SARS-CoV-2 RNA is generally detectable in upper and lower  respiratory specimens during the acute phase of infection. The lowest  concentration of SARS-CoV-2 viral copies this assay can detect is 250  copies / mL. A negative result does not preclude SARS-CoV-2 infection  and should not be used as the sole basis for treatment or other  patient management decisions.  A negative result may occur with  improper specimen collection / handling, submission of specimen other  than nasopharyngeal swab, presence of viral mutation(s) within the  areas targeted by this assay, and inadequate number of viral copies  (<250 copies / mL). A negative result must be combined with clinical  observations, patient history, and epidemiological information. If result is POSITIVE SARS-CoV-2 target nucleic acids are DETECTED. The SARS-CoV-2 RNA is generally detectable in upper and lower  respiratory specimens dur ing the acute phase of infection.  Positive  results are indicative of active infection with SARS-CoV-2.  Clinical  correlation with patient history and other diagnostic information is  necessary to determine patient infection  status.  Positive results do  not rule out bacterial infection or co-infection with other viruses. If result is PRESUMPTIVE POSTIVE SARS-CoV-2 nucleic acids MAY BE PRESENT.   A presumptive positive result was obtained on the submitted specimen  and confirmed on repeat testing.  While 2019 novel coronavirus  (SARS-CoV-2) nucleic acids may be present in the submitted sample   additional confirmatory testing may be necessary for epidemiological  and / or clinical management purposes  to differentiate between  SARS-CoV-2 and other Sarbecovirus currently known to infect humans.  If clinically indicated additional testing with an alternate test  methodology [phone]) is advised. The SARS-CoV-2 RNA is generally  detectable in upper and lower respiratory sp ecimens during the acute  phase of infection. The expected result is Negative. Fact Sheet for Patients:  StrictlyIdeas.no Fact Sheet for Healthcare Providers: BankingDealers.co.za This test is not yet approved or cleared by the [place] FDA and has been authorized for detection and/or diagnosis of SARS-CoV-2 by FDA under an Emergency Use Authorization (EUA).  This EUA will remain in effect (meaning this test can be used) for the duration of the COVID-19 declaration under Section 564(b)(1) of the Act, 21 U.S.C. section 360bbb-3(b)(1), unless the authorization is terminated or revoked sooner. Performed at [facility], [place] [address] [place], [place] [address]   Gastrointestinal Panel by PCR , Stool     Status: None   Collection Time: 05/02/19  6:37 PM   Specimen: Stool  Result Value Ref Range Status   Campylobacter species NOT DETECTED NOT DETECTED Final   Plesimonas shigelloides NOT DETECTED NOT DETECTED Final   Salmonella species NOT DETECTED NOT DETECTED Final   Yersinia enterocolitica NOT DETECTED NOT DETECTED Final   Vibrio species NOT DETECTED NOT DETECTED Final   Vibrio cholerae NOT DETECTED NOT DETECTED Final   Enteroaggregative E coli (EAEC) NOT DETECTED NOT DETECTED Final   Enteropathogenic E coli (EPEC) NOT DETECTED NOT DETECTED Final   Enterotoxigenic E coli (ETEC) NOT DETECTED NOT DETECTED Final   Shiga like toxin producing E coli (STEC) NOT DETECTED NOT DETECTED Final   Shigella/Enteroinvasive E coli (EIEC) NOT DETECTED  NOT DETECTED Final   Cryptosporidium NOT DETECTED NOT DETECTED Final   Cyclospora cayetanensis NOT DETECTED NOT DETECTED Final   Entamoeba histolytica NOT DETECTED NOT DETECTED Final   Giardia lamblia NOT DETECTED NOT DETECTED Final   Adenovirus F40/41 NOT DETECTED NOT DETECTED Final   Astrovirus NOT DETECTED NOT DETECTED Final   Norovirus GI/GII NOT DETECTED NOT DETECTED Final   Rotavirus A NOT DETECTED NOT DETECTED Final   Sapovirus (I, II, IV, and V) NOT DETECTED NOT DETECTED Final    Comment: Performed at [facility], [place] [place], [place] [address]  Culture, blood (routine x 2)     Status: Abnormal (Preliminary result)   Collection Time: 05/02/19  6:58 PM   Specimen: BLOOD  Result Value Ref Range Status   Specimen Description   Final    BLOOD LEFT ANTECUBITAL Performed at [facility], [place] [address], [place], [place] [address]    Special Requests   Final    BOTTLES DRAWN AEROBIC AND ANAEROBIC Blood Culture adequate volume Performed at [place] [address], [place], [place] [address]    Culture  Setup Time   Final    GRAM NEGATIVE RODS IN BOTH AEROBIC AND ANAEROBIC BOTTLES CRITICAL RESULT CALLED TO, READ BACK BY AND VERIFIED WITH: Chales Abrahams PharmD 13:15 05/03/19 ([email or web address])  Performed at [facility], [place] [address] [place], [place] [address]    Culture PROTEUS MIRABILIS (A)  Final   Report Status PENDING  Incomplete  Blood Culture ID Panel (Reflexed)     Status: Abnormal   Collection Time: 05/02/19  6:58 PM  Result Value Ref Range Status   Enterococcus species NOT DETECTED NOT DETECTED Final   Listeria monocytogenes NOT DETECTED NOT DETECTED Final   Staphylococcus species NOT DETECTED NOT DETECTED Final   Staphylococcus aureus (BCID) NOT DETECTED NOT DETECTED Final   Streptococcus species NOT DETECTED NOT DETECTED Final   Streptococcus agalactiae NOT DETECTED NOT DETECTED Final   Streptococcus  pneumoniae NOT DETECTED NOT DETECTED Final   Streptococcus pyogenes NOT DETECTED NOT DETECTED Final   Acinetobacter baumannii NOT DETECTED NOT DETECTED Final   Enterobacteriaceae species DETECTED (A) NOT DETECTED Final    Comment: Enterobacteriaceae represent a large family of gram-negative bacteria, not a single organism. CRITICAL RESULT CALLED TO, READ BACK BY AND VERIFIED WITH: Chales Abrahams PharmD 13:15 05/03/19 ([email or web address])    Enterobacter cloacae complex NOT DETECTED NOT DETECTED Final   Escherichia coli NOT DETECTED NOT DETECTED Final   Klebsiella oxytoca NOT DETECTED NOT DETECTED Final   Klebsiella pneumoniae NOT DETECTED NOT DETECTED Final   Proteus species DETECTED (A) NOT DETECTED Final    Comment: CRITICAL RESULT CALLED TO, READ BACK BY AND VERIFIED WITH: Chales Abrahams PharmD 13:15 05/03/19 ([email or web address])    Serratia marcescens NOT DETECTED NOT DETECTED Final   Carbapenem resistance NOT DETECTED NOT DETECTED Final   Haemophilus influenzae NOT DETECTED NOT DETECTED Final   Neisseria meningitidis NOT DETECTED NOT DETECTED Final   Pseudomonas aeruginosa NOT DETECTED NOT DETECTED Final   Candida albicans NOT DETECTED NOT DETECTED Final   Candida glabrata NOT DETECTED NOT DETECTED Final   Candida krusei NOT DETECTED NOT DETECTED Final   Candida parapsilosis NOT DETECTED NOT DETECTED Final   Candida tropicalis NOT DETECTED NOT DETECTED Final    Comment: Performed at [facility], [place] [address] [place], [place] [address]  Culture, blood (routine x 2)     Status: None (Preliminary result)   Collection Time: 05/02/19  7:05 PM   Specimen: BLOOD  Result Value Ref Range Status   Specimen Description   Final    BLOOD RIGHT ANTECUBITAL Performed at [place] [address], [place], [place] [address]    Special Requests   Final    BOTTLES DRAWN AEROBIC AND ANAEROBIC Blood Culture adequate volume Performed at [place] [address],  [place], [place] [address]    Culture  Setup Time   Final    GRAM NEGATIVE RODS ANAEROBIC BOTTLE ONLY CRITICAL VALUE NOTED.  VALUE IS CONSISTENT WITH PREVIOUSLY REPORTED AND CALLED VALUE. Performed at [facility], [place] [address] [place], [place] [address]    Culture GRAM NEGATIVE RODS  Final   Report Status PENDING  Incomplete  C difficile quick scan w PCR reflex     Status: Abnormal   Collection Time: 05/02/19 11:42 PM   Specimen: STOOL  Result Value Ref Range Status   C Diff antigen POSITIVE (A) NEGATIVE Final   C Diff toxin NEGATIVE NEGATIVE Final   C Diff interpretation Results are indeterminate. See PCR results.  Final    Comment: Performed at [facility], [place] [address] [place], [place] [address]  C. Diff by PCR, Reflexed     Status: Abnormal   Collection  Time: 05/02/19 11:42 PM  Result Value Ref Range Status   Toxigenic C. Difficile by PCR POSITIVE (A) NEGATIVE Final    Comment: Positive for toxigenic C. difficile with little to no toxin production. Only treat if clinical presentation suggests symptomatic illness. Performed at [facility], [place] [address], [place], [place] [address]     Studies/Results: Dg Abdomen 1 View  Result Date: 05/02/2019 CLINICAL DATA:  Diarrhea with abdominal bloating. EXAM: ABDOMEN - 1 VIEW COMPARISON:  CT scan 03/28/2019 FINDINGS: Supine view the abdomen shows no gaseous bowel dilatation to suggest obstruction. Lap band has a vertical orientation, stable since CT scan and slightly more vertical than on 8 KUB from 11/03/2016. Surgical clips right upper quadrant suggest prior cholecystectomy. Visualized bony anatomy unremarkable. IMPRESSION: No evidence for bowel obstruction. Vertical orientation of the lap band is similar to the recent CT scout image but appears to have become more vertical comparing to the KUB from 11/03/2016. Electronically Signed   By: Misty Stanley M.D.   On: 05/02/2019 20:50   Dg Chest Portable 1  View  Result Date: 05/02/2019 CLINICAL DATA:  Shortness of breath EXAM: PORTABLE CHEST 1 VIEW COMPARISON:  08/03/2007 FINDINGS: Mild cardiomegaly. No focal opacity or pleural effusion. No pneumothorax. IMPRESSION: No active disease.  Cardiomegaly Electronically Signed   By: Donavan Foil M.D.   On: 05/02/2019 19:18   Dg Fluoro Rm 1-60 Min - No Report  Result Date: 05/03/2019 Fluoroscopy was utilized by the requesting physician.  No radiographic interpretation.   Ct Renal Stone Study  Result Date: 05/03/2019 CLINICAL DATA:  Flank pain, abdominal pain, nausea, vomiting, acute on chronic diarrhea, pyelonephritis, ulcerative colitis EXAM: CT ABDOMEN AND PELVIS WITHOUT CONTRAST TECHNIQUE: Multidetector CT imaging of the abdomen and pelvis was performed following the standard protocol without IV contrast. COMPARISON:  03/28/2019 FINDINGS: Lower chest: Bibasilar scarring or atelectasis. Small hiatal hernia. Hepatobiliary: No focal liver abnormality is seen. Status post cholecystectomy. No biliary dilatation. Pancreas: Unremarkable. No pancreatic ductal dilatation or surrounding inflammatory changes. Spleen: Normal in size without significant abnormality. Adrenals/Urinary Tract: Adrenal glands are unremarkable. There is a 3 mm calculus of the proximal right ureter with mild right hydronephrosis and adjacent perinephric fat stranding. There is an additional punctuate nonobstructive calculus of the inferior pole of left kidney. Bladder is unremarkable. Stomach/Bowel: Adjustable gastric lap band and reservoir. Appendix appears normal. The colon is generally featureless and thickened appearing, particularly in the ascending and transverse colon. Vascular/Lymphatic: No significant vascular findings are present. No enlarged abdominal or pelvic lymph nodes. Reproductive: No mass or other significant abnormality. Other: No abdominal wall hernia or abnormality. No abdominopelvic ascites. Musculoskeletal: No acute or  significant osseous findings. IMPRESSION: 1. There is a 3 mm calculus of the proximal right ureter with mild right hydronephrosis and adjacent perinephric fat stranding. This is calculus is in unchanged position compared to prior CT dated 03/28/2019, however perinephric fat stranding is new, concerning for infection. There is an additional punctuate nonobstructive calculus of the inferior pole of left kidney. 2. The colon is generally featureless and thickened appearing, particularly in the ascending and transverse colon, in keeping with reported history of ulcerative colitis. 3. Other chronic, incidental, and postoperative findings as detailed above. Electronically Signed   By: Eddie Candle M.D.   On: 05/03/2019 16:48    Assessment/Plan: POD#1 right stent placement for a right ureteral calculus and sepsis from a urinary source  1. Please continue foley catheter to straight drain. If the patient 's  fever decrease the foley can be removed tomorrow morning 2. She will be scheduled for outpatient management of her ureteral calculus pending resolution of her sepsis   LOS: 2 days   Joyce Kim 05/04/2019, 10:36 AM

## 2019-05-04 NOTE — Anesthesia Postprocedure Evaluation (Signed)
Anesthesia Post Note  Patient: [place]  Procedure(s) Performed: CYSTOSCOPY WITH RETROGRADE PYELOGRAM/URETERAL STENT PLACEMENT (Right )     Patient location during evaluation: PACU Anesthesia Type: General Level of consciousness: awake and alert Pain management: pain level controlled Vital Signs Assessment: post-procedure vital signs reviewed and stable Respiratory status: spontaneous breathing, nonlabored ventilation, respiratory function stable and patient connected to nasal cannula oxygen Cardiovascular status: blood pressure returned to baseline and stable Postop Assessment: no apparent nausea or vomiting Anesthetic complications: no    Last Vitals:  Vitals:   05/03/19 2207 05/03/19 2323  BP: (!) 113/58 114/61  Pulse: (!) 54 (!) 53  Resp: 14   Temp: 36.4 C 36.5 C  SpO2: 94% 97%    Last Pain:  Vitals:   05/03/19 2323  TempSrc: Oral  PainSc:                  Tiajuana Amass

## 2019-05-05 LAB — CULTURE, BLOOD (ROUTINE X 2)
Special Requests: ADEQUATE
Special Requests: ADEQUATE

## 2019-05-05 LAB — URINE CULTURE: Culture: >=100000 — AB

## 2019-05-05 MED ORDER — MESALAMINE 1.2 G PO TBEC
2.4000 g | DELAYED_RELEASE_TABLET | Freq: Two times a day (BID) | ORAL | Status: DC
  Administered 2019-05-05 – 2019-05-08 (×6): 2.4 g via ORAL
  Filled 2019-05-05 (×7): qty 2

## 2019-05-05 MED ORDER — LEVOTHYROXINE SODIUM 50 MCG PO TABS
50.0000 ug | ORAL_TABLET | Freq: Every day | ORAL | Status: DC
  Administered 2019-05-06 – 2019-05-08 (×3): 50 ug via ORAL
  Filled 2019-05-05 (×3): qty 1

## 2019-05-05 MED ORDER — ESCITALOPRAM OXALATE 10 MG PO TABS
10.0000 mg | ORAL_TABLET | Freq: Every day | ORAL | Status: DC
  Administered 2019-05-05 – 2019-05-08 (×4): 10 mg via ORAL
  Filled 2019-05-05 (×4): qty 1

## 2019-05-05 NOTE — Progress Notes (Signed)
PROGRESS NOTE    Joyce Kim  LFY:[id number] DOB: 22-Dec-1960 DOA: 05/02/2019 PCP: Prince Solian, MD     Brief Narrative:  Joyce Kim is a 58 year old female with past medical history significant for ulcerative colitis, hypothyroidism, GERD, recent right kidney stone with hydronephrosis who was admitted on 05/02/2019 after presenting with complaint of abdominal pain, fever, diarrhea and generalized body aches.  She was found to have ulcerative colitis flareup, C. difficile as well as pyelonephritis.  GI as well as urology were consulted.  She underwent right ureteral stent placement due to right ureteral stone with mild hydroureteronephrosis and acute pyelonephrosis.  New events last 24 hours / Subjective: Doing better today.  Tolerating oral diet.  Continues to have watery bowel movements, had 7 episodes of liquid yellow stool yesterday with some blood.  No abdominal pain, but does have some pelvic pain.  Has been ambulating around the room.  Assessment & Plan:   Active Problems:   Pyelonephritis  C. difficile colitis, history of ulcerative colitis -C. difficile antigen positive, toxin negative but PCR was positive.  Patient was started on p.o. vancomycin -GI consulted; did not feel that her acute on chronic diarrhea appeared to be a ulcerative colitis flare.  Continue Lialda, dicyclomine.  Follow-up with Dr. Carlean Purl as an outpatient -Continues to have very loose bowel movements, continue treatment  Sepsis secondary to acute pyelonephritis, POA  -Right ureteral stone with mild hydroureteronephrosis status post right ureteral stent placement -Follow-up with Dr. Alyson Ingles, urology as an outpatient  -Remove Foley catheter today -Continue antibiotics for 14 days   Sepsis secondary to Proteus bacteremia, POA  -Sensitive to cephalosporin -Continue IV rocephin   Hypothyroidism -Continue Synthroid  Depression -Continue Lexapro  Morbid obesity -Status post  lap band -Body mass index is 43.74 kg/m.    DVT prophylaxis: Lovenox Code Status: Full Family Communication: None Disposition Plan: Pending clinical improvement in diarrhea   Consultants:   GI  Urology  Procedures:   S/p right ureteral stent placement   Antimicrobials:  Anti-infectives (From admission, onward)   Start     Dose/Rate Route Frequency Ordered Stop   05/04/19 1445  vancomycin (VANCOCIN) 50 mg/mL oral solution 125 mg     125 mg Oral 4 times daily 05/04/19 1433 05/14/19 1359   05/03/19 2200  cefTRIAXone (ROCEPHIN) 1 g in sodium chloride 0.9 % 100 mL IVPB  Status:  Discontinued     1 g 200 mL/hr over 30 Minutes Intravenous Every 24 hours 05/02/19 2341 05/03/19 1326   05/03/19 1400  cefTRIAXone (ROCEPHIN) 2 g in sodium chloride 0.9 % 100 mL IVPB     2 g 200 mL/hr over 30 Minutes Intravenous Every 24 hours 05/03/19 1326     05/03/19 0015  metroNIDAZOLE (FLAGYL) IVPB 500 mg  Status:  Discontinued     500 mg 100 mL/hr over 60 Minutes Intravenous Every 8 hours 05/03/19 0011 05/04/19 1429   05/02/19 2145  cefTRIAXone (ROCEPHIN) 1 g in sodium chloride 0.9 % 100 mL IVPB     1 g 200 mL/hr over 30 Minutes Intravenous  Once 05/02/19 2135 05/02/19 2236       Objective: Vitals:   05/04/19 0447 05/04/19 1331 05/04/19 2213 05/05/19 0529  BP: 122/71 129/71 (!) 144/79 128/72  Pulse: (!) 48 (!) 56 (!) 51 (!) 47  Resp: 16 16 20 15   Temp: 97.7 F (36.5 C)  97.8 F (36.6 C) 97.9 F (36.6 C)  TempSrc: Oral  Oral Oral  SpO2: 97% 98% 96% 95%  Weight:      Height:        Intake/Output Summary (Last 24 hours) at 05/05/2019 1106 Last data filed at 05/05/2019 0650 Gross per 24 hour  Intake 2173.77 ml  Output 600 ml  Net 1573.77 ml   Filed Weights   05/02/19 1727  Weight: 122.9 kg    Examination:  General exam: Appears calm and comfortable  Respiratory system: Clear to auscultation. Respiratory effort normal. Cardiovascular system: S1 & S2 heard, RRR. No JVD,  murmurs, rubs, gallops or clicks. No pedal edema. Gastrointestinal system: Abdomen is nondistended, soft and tender to palpation in lower pelvic region no other abdominal pain.  Normal bowel sounds Central nervous system: Alert and oriented. No focal neurological deficits. Extremities: Symmetric 5 x 5 power. Skin: No rashes, lesions or ulcers Psychiatry: Judgement and insight appear normal. Mood & affect appropriate.   Data Reviewed: I have personally reviewed following labs and imaging studies  CBC: Recent Labs  Lab 05/02/19 1919 05/03/19 0336 05/04/19 0443  WBC 11.7* 15.0* 9.7  NEUTROABS 9.4*  --  8.3*  HGB 13.3 12.5 11.8*  HCT 41.5 38.8 38.4  MCV 89.6 90.2 91.9  PLT 312 270 008   Basic Metabolic Panel: Recent Labs  Lab 05/02/19 1919 05/03/19 0029 05/03/19 0336 05/04/19 0443  NA 141  --  139 139  K 3.0*  --  3.1* 3.8  CL 104  --  107 105  CO2 25  --  21* 23  GLUCOSE 134*  --  150* 169*  BUN 6  --  9 14  CREATININE 0.91  --  0.89 0.86  CALCIUM 8.6*  --  8.0* 8.3*  MG 1.8 1.9  --  2.0  PHOS  --  3.3  --   --    GFR: Estimated Creatinine Clearance: 96.5 mL/min (by C-G formula based on SCr of 0.86 mg/dL). Liver Function Tests: Recent Labs  Lab 05/02/19 1919 05/04/19 0443  AST 21 28  ALT 19 23  ALKPHOS 88 71  BILITOT 0.9 0.8  PROT 6.9 6.1*  ALBUMIN 3.5 2.9*   No results for input(s): LIPASE, AMYLASE in the last 168 hours. No results for input(s): AMMONIA in the last 168 hours. Coagulation Profile: No results for input(s): INR, PROTIME in the last 168 hours. Cardiac Enzymes: No results for input(s): CKTOTAL, CKMB, CKMBINDEX, TROPONINI in the last 168 hours. BNP (last 3 results) No results for input(s): PROBNP in the last 8760 hours. HbA1C: No results for input(s): HGBA1C in the last 72 hours. CBG: Recent Labs  Lab 05/03/19 1018  GLUCAP 126*   Lipid Profile: No results for input(s): CHOL, HDL, LDLCALC, TRIG, CHOLHDL, LDLDIRECT in the last 72  hours. Thyroid Function Tests: No results for input(s): TSH, T4TOTAL, FREET4, T3FREE, THYROIDAB in the last 72 hours. Anemia Panel: No results for input(s): VITAMINB12, FOLATE, FERRITIN, TIBC, IRON, RETICCTPCT in the last 72 hours. Sepsis Labs: Recent Labs  Lab 05/02/19 1918  LATICACIDVEN 1.7    Recent Results (from the past 240 hour(s))  Urine culture     Status: Abnormal   Collection Time: 05/02/19  6:14 PM   Specimen: Urine, Clean Catch  Result Value Ref Range Status   Specimen Description   Final    URINE, CLEAN CATCH Performed at [facility], [place] [address] [place], [place] [address]    Special Requests   Final    NONE Performed at [facility], [place]  [address] [place], [place] [address]    Culture >=100,000 COLONIES/mL PROTEUS MIRABILIS (A)  Final   Report Status 05/05/2019 FINAL  Final   Organism ID, Bacteria PROTEUS MIRABILIS (A)  Final      Susceptibility   Proteus mirabilis - MIC*    AMPICILLIN <=2 SENSITIVE Sensitive     CEFAZOLIN <=4 SENSITIVE Sensitive     CEFTRIAXONE <=1 SENSITIVE Sensitive     CIPROFLOXACIN <=0.25 SENSITIVE Sensitive     GENTAMICIN 2 SENSITIVE Sensitive     IMIPENEM 8 INTERMEDIATE Intermediate     NITROFURANTOIN 256 RESISTANT Resistant     TRIMETH/SULFA <=20 SENSITIVE Sensitive     AMPICILLIN/SULBACTAM <=2 SENSITIVE Sensitive     PIP/TAZO <=4 SENSITIVE Sensitive     * >=100,000 COLONIES/mL PROTEUS MIRABILIS  SARS Coronavirus 2 (CEPHEID- Performed in [place] hospital lab), Hosp Order     Status: None   Collection Time: 05/02/19  6:36 PM   Specimen: Nasopharyngeal Swab  Result Value Ref Range Status   SARS Coronavirus 2 NEGATIVE NEGATIVE Final    Comment: (NOTE) If result is NEGATIVE SARS-CoV-2 target nucleic acids are NOT DETECTED. The SARS-CoV-2 RNA is generally detectable in upper and lower  respiratory specimens during the acute phase of infection. The lowest  concentration of SARS-CoV-2  viral copies this assay can detect is 250  copies / mL. A negative result does not preclude SARS-CoV-2 infection  and should not be used as the sole basis for treatment or other  patient management decisions.  A negative result may occur with  improper specimen collection / handling, submission of specimen other  than nasopharyngeal swab, presence of viral mutation(s) within the  areas targeted by this assay, and inadequate number of viral copies  (<250 copies / mL). A negative result must be combined with clinical  observations, patient history, and epidemiological information. If result is POSITIVE SARS-CoV-2 target nucleic acids are DETECTED. The SARS-CoV-2 RNA is generally detectable in upper and lower  respiratory specimens dur ing the acute phase of infection.  Positive  results are indicative of active infection with SARS-CoV-2.  Clinical  correlation with patient history and other diagnostic information is  necessary to determine patient infection status.  Positive results do  not rule out bacterial infection or co-infection with other viruses. If result is PRESUMPTIVE POSTIVE SARS-CoV-2 nucleic acids MAY BE PRESENT.   A presumptive positive result was obtained on the submitted specimen  and confirmed on repeat testing.  While 2019 novel coronavirus  (SARS-CoV-2) nucleic acids may be present in the submitted sample  additional confirmatory testing may be necessary for epidemiological  and / or clinical management purposes  to differentiate between  SARS-CoV-2 and other Sarbecovirus currently known to infect humans.  If clinically indicated additional testing with an alternate test  methodology [phone]) is advised. The SARS-CoV-2 RNA is generally  detectable in upper and lower respiratory sp ecimens during the acute  phase of infection. The expected result is Negative. Fact Sheet for Patients:  StrictlyIdeas.no Fact Sheet for Healthcare  Providers: BankingDealers.co.za This test is not yet approved or cleared by the [place] FDA and has been authorized for detection and/or diagnosis of SARS-CoV-2 by FDA under an Emergency Use Authorization (EUA).  This EUA will remain in effect (meaning this test can be used) for the duration of the COVID-19 declaration under Section 564(b)(1) of the Act, 21 U.S.C. section 360bbb-3(b)(1), unless the authorization is terminated or revoked sooner. Performed at [facility], [place]  [address] [place], [place] [address]   Gastrointestinal Panel by PCR , Stool     Status: None   Collection Time: 05/02/19  6:37 PM   Specimen: Stool  Result Value Ref Range Status   Campylobacter species NOT DETECTED NOT DETECTED Final   Plesimonas shigelloides NOT DETECTED NOT DETECTED Final   Salmonella species NOT DETECTED NOT DETECTED Final   Yersinia enterocolitica NOT DETECTED NOT DETECTED Final   Vibrio species NOT DETECTED NOT DETECTED Final   Vibrio cholerae NOT DETECTED NOT DETECTED Final   Enteroaggregative E coli (EAEC) NOT DETECTED NOT DETECTED Final   Enteropathogenic E coli (EPEC) NOT DETECTED NOT DETECTED Final   Enterotoxigenic E coli (ETEC) NOT DETECTED NOT DETECTED Final   Shiga like toxin producing E coli (STEC) NOT DETECTED NOT DETECTED Final   Shigella/Enteroinvasive E coli (EIEC) NOT DETECTED NOT DETECTED Final   Cryptosporidium NOT DETECTED NOT DETECTED Final   Cyclospora cayetanensis NOT DETECTED NOT DETECTED Final   Entamoeba histolytica NOT DETECTED NOT DETECTED Final   Giardia lamblia NOT DETECTED NOT DETECTED Final   Adenovirus F40/41 NOT DETECTED NOT DETECTED Final   Astrovirus NOT DETECTED NOT DETECTED Final   Norovirus GI/GII NOT DETECTED NOT DETECTED Final   Rotavirus A NOT DETECTED NOT DETECTED Final   Sapovirus (I, II, IV, and V) NOT DETECTED NOT DETECTED Final    Comment: Performed at [facility], [place] [place], [place] [address]  Culture, blood (routine x 2)     Status: Abnormal   Collection Time: 05/02/19  6:58 PM   Specimen: BLOOD  Result Value Ref Range Status   Specimen Description   Final    BLOOD LEFT ANTECUBITAL Performed at [facility], [place] [address], [place], [place] [address]    Special Requests   Final    BOTTLES DRAWN AEROBIC AND ANAEROBIC Blood Culture adequate volume Performed at [place] [address], [place], [place] [address]    Culture  Setup Time   Final    GRAM NEGATIVE RODS IN BOTH AEROBIC AND ANAEROBIC BOTTLES CRITICAL RESULT CALLED TO, READ BACK BY AND VERIFIED WITH: Chales Abrahams PharmD 13:15 05/03/19 ([email or web address]) Performed at [facility], [place] [address] [place], [place] [address]    Culture PROTEUS MIRABILIS (A)  Final   Report Status 05/05/2019 FINAL  Final   Organism ID, Bacteria PROTEUS MIRABILIS  Final      Susceptibility   Proteus mirabilis - MIC*    AMPICILLIN <=2 SENSITIVE Sensitive     CEFAZOLIN <=4 SENSITIVE Sensitive     CEFEPIME <=1 SENSITIVE Sensitive     CEFTAZIDIME <=1 SENSITIVE Sensitive     CEFTRIAXONE <=1 SENSITIVE Sensitive     CIPROFLOXACIN <=0.25 SENSITIVE Sensitive     GENTAMICIN 2 SENSITIVE Sensitive     IMIPENEM 4 SENSITIVE Sensitive     TRIMETH/SULFA <=20 SENSITIVE Sensitive     AMPICILLIN/SULBACTAM <=2 SENSITIVE Sensitive     PIP/TAZO <=4 SENSITIVE Sensitive     * PROTEUS MIRABILIS  Blood Culture ID Panel (Reflexed)     Status: Abnormal   Collection Time: 05/02/19  6:58 PM  Result Value Ref Range Status   Enterococcus species NOT DETECTED NOT DETECTED Final   Listeria monocytogenes NOT DETECTED NOT DETECTED Final   Staphylococcus species NOT DETECTED NOT DETECTED Final   Staphylococcus aureus (BCID) NOT DETECTED NOT DETECTED Final   Streptococcus species NOT DETECTED NOT DETECTED Final   Streptococcus agalactiae NOT DETECTED NOT  DETECTED Final   Streptococcus pneumoniae NOT  DETECTED NOT DETECTED Final   Streptococcus pyogenes NOT DETECTED NOT DETECTED Final   Acinetobacter baumannii NOT DETECTED NOT DETECTED Final   Enterobacteriaceae species DETECTED (A) NOT DETECTED Final    Comment: Enterobacteriaceae represent a large family of gram-negative bacteria, not a single organism. CRITICAL RESULT CALLED TO, READ BACK BY AND VERIFIED WITH: Chales Abrahams PharmD 13:15 05/03/19 ([email or web address])    Enterobacter cloacae complex NOT DETECTED NOT DETECTED Final   Escherichia coli NOT DETECTED NOT DETECTED Final   Klebsiella oxytoca NOT DETECTED NOT DETECTED Final   Klebsiella pneumoniae NOT DETECTED NOT DETECTED Final   Proteus species DETECTED (A) NOT DETECTED Final    Comment: CRITICAL RESULT CALLED TO, READ BACK BY AND VERIFIED WITH: Chales Abrahams PharmD 13:15 05/03/19 ([email or web address])    Serratia marcescens NOT DETECTED NOT DETECTED Final   Carbapenem resistance NOT DETECTED NOT DETECTED Final   Haemophilus influenzae NOT DETECTED NOT DETECTED Final   Neisseria meningitidis NOT DETECTED NOT DETECTED Final   Pseudomonas aeruginosa NOT DETECTED NOT DETECTED Final   Candida albicans NOT DETECTED NOT DETECTED Final   Candida glabrata NOT DETECTED NOT DETECTED Final   Candida krusei NOT DETECTED NOT DETECTED Final   Candida parapsilosis NOT DETECTED NOT DETECTED Final   Candida tropicalis NOT DETECTED NOT DETECTED Final    Comment: Performed at [facility], [place] [address], [place], [place] [address]  Culture, blood (routine x 2)     Status: Abnormal   Collection Time: 05/02/19  7:05 PM   Specimen: BLOOD  Result Value Ref Range Status   Specimen Description   Final    BLOOD RIGHT ANTECUBITAL Performed at [place] [address], [place], [place] [address]    Special Requests   Final    BOTTLES DRAWN AEROBIC AND ANAEROBIC Blood Culture adequate volume Performed at [place] [address] [place], [place] [address]    Culture  Setup  Time   Final    GRAM NEGATIVE RODS ANAEROBIC BOTTLE ONLY CRITICAL VALUE NOTED.  VALUE IS CONSISTENT WITH PREVIOUSLY REPORTED AND CALLED VALUE.    Culture (A)  Final    PROTEUS MIRABILIS SUSCEPTIBILITIES PERFORMED ON PREVIOUS CULTURE WITHIN THE LAST 5 DAYS. Performed at [facility], [place] [address], [place], [place] [address]    Report Status 05/05/2019 FINAL  Final  C difficile quick scan w PCR reflex     Status: Abnormal   Collection Time: 05/02/19 11:42 PM   Specimen: STOOL  Result Value Ref Range Status   C Diff antigen POSITIVE (A) NEGATIVE Final   C Diff toxin NEGATIVE NEGATIVE Final   C Diff interpretation Results are indeterminate. See PCR results.  Final    Comment: Performed at [facility], [place] [address], [place], [place] [address]  Stool culture (children & immunocomp patients)     Status: None (Preliminary result)   Collection Time: 05/02/19 11:42 PM   Specimen: Stool  Result Value Ref Range Status   Salmonella/Shigella Screen PENDING  Incomplete   Campylobacter Culture PENDING  Incomplete   E coli, Shiga toxin Assay Negative Negative Final    Comment: (NOTE) Performed At: [facility] [address] [place], [place] [id number] Rush Farmer MD ON:[id number]   C. Diff by PCR, Reflexed     Status: Abnormal   Collection Time: 05/02/19 11:42 PM  Result Value Ref Range Status   Toxigenic C. Difficile by PCR POSITIVE (  A) NEGATIVE Final    Comment: Positive for toxigenic C. difficile with little to no toxin production. Only treat if clinical presentation suggests symptomatic illness. Performed at [facility], [place] [address], [place], [place] [address]        Radiology Studies: Dg Fluoro Rm 1-60 Min - No Report  Result Date: 05/03/2019 Fluoroscopy was utilized by the requesting physician.  No radiographic interpretation.   Ct Renal Stone Study  Result Date: 05/03/2019 CLINICAL DATA:  Flank pain, abdominal pain, nausea,  vomiting, acute on chronic diarrhea, pyelonephritis, ulcerative colitis EXAM: CT ABDOMEN AND PELVIS WITHOUT CONTRAST TECHNIQUE: Multidetector CT imaging of the abdomen and pelvis was performed following the standard protocol without IV contrast. COMPARISON:  03/28/2019 FINDINGS: Lower chest: Bibasilar scarring or atelectasis. Small hiatal hernia. Hepatobiliary: No focal liver abnormality is seen. Status post cholecystectomy. No biliary dilatation. Pancreas: Unremarkable. No pancreatic ductal dilatation or surrounding inflammatory changes. Spleen: Normal in size without significant abnormality. Adrenals/Urinary Tract: Adrenal glands are unremarkable. There is a 3 mm calculus of the proximal right ureter with mild right hydronephrosis and adjacent perinephric fat stranding. There is an additional punctuate nonobstructive calculus of the inferior pole of left kidney. Bladder is unremarkable. Stomach/Bowel: Adjustable gastric lap band and reservoir. Appendix appears normal. The colon is generally featureless and thickened appearing, particularly in the ascending and transverse colon. Vascular/Lymphatic: No significant vascular findings are present. No enlarged abdominal or pelvic lymph nodes. Reproductive: No mass or other significant abnormality. Other: No abdominal wall hernia or abnormality. No abdominopelvic ascites. Musculoskeletal: No acute or significant osseous findings. IMPRESSION: 1. There is a 3 mm calculus of the proximal right ureter with mild right hydronephrosis and adjacent perinephric fat stranding. This is calculus is in unchanged position compared to prior CT dated 03/28/2019, however perinephric fat stranding is new, concerning for infection. There is an additional punctuate nonobstructive calculus of the inferior pole of left kidney. 2. The colon is generally featureless and thickened appearing, particularly in the ascending and transverse colon, in keeping with reported history of ulcerative  colitis. 3. Other chronic, incidental, and postoperative findings as detailed above. Electronically Signed   By: Eddie Candle M.D.   On: 05/03/2019 16:48      Scheduled Meds:  dicyclomine  10 mg Oral TID AC & HS   enoxaparin (LOVENOX) injection  40 mg Subcutaneous Q24H   escitalopram  10 mg Oral Daily   famotidine  20 mg Oral Daily   [START ON 05/06/2019] levothyroxine  50 mcg Oral QAC breakfast   mesalamine  2.4 g Oral BID WC   vancomycin  125 mg Oral QID   Continuous Infusions:  cefTRIAXone (ROCEPHIN)  IV 2 g (05/04/19 1402)     LOS: 3 days    Time spent: 40 minutes   Dessa Phi, DO [facility] [email or web address] 05/05/2019, 11:06 AM

## 2019-05-05 NOTE — Plan of Care (Signed)
Patient lying in bed this morning; pain well controlled; no nausea. Hopeful to get her foley out today. Will continue to monitor.

## 2019-05-05 NOTE — Progress Notes (Signed)
2 Days Post-Op Subjective: Patient reports no flank pain. No nausea/vomiting. Afebrile. No hematuria  Objective: Vital signs in last 24 hours: Temp:  [97.8 F (36.6 C)-97.9 F (36.6 C)] 97.9 F (36.6 C) (06/21 0529) Pulse Rate:  [47-56] 47 (06/21 0529) Resp:  [15-20] 15 (06/21 0529) BP: (128-144)/(71-79) 128/72 (06/21 0529) SpO2:  [95 %-98 %] 95 % (06/21 0529)  Intake/Output from previous day: 06/20 0701 - 06/21 0700 In: 2813.7 [P.O.:600; I.V.:2013.7; IV Piggyback:200] Out: 850 [Urine:850] Intake/Output this shift: No intake/output data recorded.  Physical Exam:  General:alert, cooperative and appears stated age GI: soft, non tender, normal bowel sounds, no palpable masses, no organomegaly, no inguinal hernia Female genitalia: not done Extremities: extremities normal, atraumatic, no cyanosis or edema  Lab Results: Recent Labs    05/02/19 1919 05/03/19 0336 05/04/19 0443  HGB 13.3 12.5 11.8*  HCT 41.5 38.8 38.4   BMET Recent Labs    05/03/19 0336 05/04/19 0443  NA 139 139  K 3.1* 3.8  CL 107 105  CO2 21* 23  GLUCOSE 150* 169*  BUN 9 14  CREATININE 0.89 0.86  CALCIUM 8.0* 8.3*   No results for input(s): LABPT, INR in the last 72 hours. No results for input(s): LABURIN in the last 72 hours. Results for orders placed or performed during the hospital encounter of 05/02/19  Urine culture     Status: Abnormal (Preliminary result)   Collection Time: 05/02/19  6:14 PM   Specimen: Urine, Clean Catch  Result Value Ref Range Status   Specimen Description   Final    URINE, CLEAN CATCH Performed at [facility], [place] [address] [place], [place] [address]    Special Requests   Final    NONE Performed at [facility], [place] [address], [place], [place] [address]    Culture (A)  Final    >=100,000 COLONIES/mL PROTEUS MIRABILIS REPEATING SUSCEPTIBILITIES Performed at [facility], [place] [address], [place],  [address]     Report Status PENDING  Incomplete  SARS Coronavirus 2 (CEPHEID- Performed in [place] hospital lab), Hosp Order     Status: None   Collection Time: 05/02/19  6:36 PM   Specimen: Nasopharyngeal Swab  Result Value Ref Range Status   SARS Coronavirus 2 NEGATIVE NEGATIVE Final    Comment: (NOTE) If result is NEGATIVE SARS-CoV-2 target nucleic acids are NOT DETECTED. The SARS-CoV-2 RNA is generally detectable in upper and lower  respiratory specimens during the acute phase of infection. The lowest  concentration of SARS-CoV-2 viral copies this assay can detect is 250  copies / mL. A negative result does not preclude SARS-CoV-2 infection  and should not be used as the sole basis for treatment or other  patient management decisions.  A negative result may occur with  improper specimen collection / handling, submission of specimen other  than nasopharyngeal swab, presence of viral mutation(s) within the  areas targeted by this assay, and inadequate number of viral copies  (<250 copies / mL). A negative result must be combined with clinical  observations, patient history, and epidemiological information. If result is POSITIVE SARS-CoV-2 target nucleic acids are DETECTED. The SARS-CoV-2 RNA is generally detectable in upper and lower  respiratory specimens dur ing the acute phase of infection.  Positive  results are indicative of active infection with SARS-CoV-2.  Clinical  correlation with patient history and other diagnostic information is  necessary to determine patient infection status.  Positive results do  not  rule out bacterial infection or co-infection with other viruses. If result is PRESUMPTIVE POSTIVE SARS-CoV-2 nucleic acids MAY BE PRESENT.   A presumptive positive result was obtained on the submitted specimen  and confirmed on repeat testing.  While 2019 novel coronavirus  (SARS-CoV-2) nucleic acids may be present in the submitted sample  additional confirmatory testing may  be necessary for epidemiological  and / or clinical management purposes  to differentiate between  SARS-CoV-2 and other Sarbecovirus currently known to infect humans.  If clinically indicated additional testing with an alternate test  methodology [phone]) is advised. The SARS-CoV-2 RNA is generally  detectable in upper and lower respiratory sp ecimens during the acute  phase of infection. The expected result is Negative. Fact Sheet for Patients:  StrictlyIdeas.no Fact Sheet for Healthcare Providers: BankingDealers.co.za This test is not yet approved or cleared by the [place] FDA and has been authorized for detection and/or diagnosis of SARS-CoV-2 by FDA under an Emergency Use Authorization (EUA).  This EUA will remain in effect (meaning this test can be used) for the duration of the COVID-19 declaration under Section 564(b)(1) of the Act, 21 U.S.C. section 360bbb-3(b)(1), unless the authorization is terminated or revoked sooner. Performed at [facility], [place] [address] [place], [place] [address]   Gastrointestinal Panel by PCR , Stool     Status: None   Collection Time: 05/02/19  6:37 PM   Specimen: Stool  Result Value Ref Range Status   Campylobacter species NOT DETECTED NOT DETECTED Final   Plesimonas shigelloides NOT DETECTED NOT DETECTED Final   Salmonella species NOT DETECTED NOT DETECTED Final   Yersinia enterocolitica NOT DETECTED NOT DETECTED Final   Vibrio species NOT DETECTED NOT DETECTED Final   Vibrio cholerae NOT DETECTED NOT DETECTED Final   Enteroaggregative E coli (EAEC) NOT DETECTED NOT DETECTED Final   Enteropathogenic E coli (EPEC) NOT DETECTED NOT DETECTED Final   Enterotoxigenic E coli (ETEC) NOT DETECTED NOT DETECTED Final   Shiga like toxin producing E coli (STEC) NOT DETECTED NOT DETECTED Final   Shigella/Enteroinvasive E coli (EIEC) NOT DETECTED NOT DETECTED Final   Cryptosporidium  NOT DETECTED NOT DETECTED Final   Cyclospora cayetanensis NOT DETECTED NOT DETECTED Final   Entamoeba histolytica NOT DETECTED NOT DETECTED Final   Giardia lamblia NOT DETECTED NOT DETECTED Final   Adenovirus F40/41 NOT DETECTED NOT DETECTED Final   Astrovirus NOT DETECTED NOT DETECTED Final   Norovirus GI/GII NOT DETECTED NOT DETECTED Final   Rotavirus A NOT DETECTED NOT DETECTED Final   Sapovirus (I, II, IV, and V) NOT DETECTED NOT DETECTED Final    Comment: Performed at [facility], [place] [place], [place] [address]  Culture, blood (routine x 2)     Status: Abnormal   Collection Time: 05/02/19  6:58 PM   Specimen: BLOOD  Result Value Ref Range Status   Specimen Description   Final    BLOOD LEFT ANTECUBITAL Performed at [facility], [place] [address] [place], [place] [address]    Special Requests   Final    BOTTLES DRAWN AEROBIC AND ANAEROBIC Blood Culture adequate volume Performed at [place] [address] [place], [place] [address]    Culture  Setup Time   Final    GRAM NEGATIVE RODS IN BOTH AEROBIC AND ANAEROBIC BOTTLES CRITICAL RESULT CALLED TO, READ BACK BY AND VERIFIED WITH: Chales Abrahams PharmD 13:15 05/03/19 ([email or web address]) Performed at [facility], [place]  [address], [place], [place] [address]    Culture PROTEUS MIRABILIS (A)  Final   Report Status 05/05/2019 FINAL  Final   Organism ID, Bacteria PROTEUS MIRABILIS  Final      Susceptibility   Proteus mirabilis - MIC*    AMPICILLIN <=2 SENSITIVE Sensitive     CEFAZOLIN <=4 SENSITIVE Sensitive     CEFEPIME <=1 SENSITIVE Sensitive     CEFTAZIDIME <=1 SENSITIVE Sensitive     CEFTRIAXONE <=1 SENSITIVE Sensitive     CIPROFLOXACIN <=0.25 SENSITIVE Sensitive     GENTAMICIN 2 SENSITIVE Sensitive     IMIPENEM 4 SENSITIVE Sensitive     TRIMETH/SULFA <=20 SENSITIVE Sensitive     AMPICILLIN/SULBACTAM <=2 SENSITIVE Sensitive     PIP/TAZO <=4 SENSITIVE Sensitive     *  PROTEUS MIRABILIS  Blood Culture ID Panel (Reflexed)     Status: Abnormal   Collection Time: 05/02/19  6:58 PM  Result Value Ref Range Status   Enterococcus species NOT DETECTED NOT DETECTED Final   Listeria monocytogenes NOT DETECTED NOT DETECTED Final   Staphylococcus species NOT DETECTED NOT DETECTED Final   Staphylococcus aureus (BCID) NOT DETECTED NOT DETECTED Final   Streptococcus species NOT DETECTED NOT DETECTED Final   Streptococcus agalactiae NOT DETECTED NOT DETECTED Final   Streptococcus pneumoniae NOT DETECTED NOT DETECTED Final   Streptococcus pyogenes NOT DETECTED NOT DETECTED Final   Acinetobacter baumannii NOT DETECTED NOT DETECTED Final   Enterobacteriaceae species DETECTED (A) NOT DETECTED Final    Comment: Enterobacteriaceae represent a large family of gram-negative bacteria, not a single organism. CRITICAL RESULT CALLED TO, READ BACK BY AND VERIFIED WITH: Chales Abrahams PharmD 13:15 05/03/19 ([email or web address])    Enterobacter cloacae complex NOT DETECTED NOT DETECTED Final   Escherichia coli NOT DETECTED NOT DETECTED Final   Klebsiella oxytoca NOT DETECTED NOT DETECTED Final   Klebsiella pneumoniae NOT DETECTED NOT DETECTED Final   Proteus species DETECTED (A) NOT DETECTED Final    Comment: CRITICAL RESULT CALLED TO, READ BACK BY AND VERIFIED WITH: Chales Abrahams PharmD 13:15 05/03/19 ([email or web address])    Serratia marcescens NOT DETECTED NOT DETECTED Final   Carbapenem resistance NOT DETECTED NOT DETECTED Final   Haemophilus influenzae NOT DETECTED NOT DETECTED Final   Neisseria meningitidis NOT DETECTED NOT DETECTED Final   Pseudomonas aeruginosa NOT DETECTED NOT DETECTED Final   Candida albicans NOT DETECTED NOT DETECTED Final   Candida glabrata NOT DETECTED NOT DETECTED Final   Candida krusei NOT DETECTED NOT DETECTED Final   Candida parapsilosis NOT DETECTED NOT DETECTED Final   Candida tropicalis NOT DETECTED NOT DETECTED Final    Comment: Performed at [facility], [place]  [address] [place], [place] [address]  Culture, blood (routine x 2)     Status: Abnormal   Collection Time: 05/02/19  7:05 PM   Specimen: BLOOD  Result Value Ref Range Status   Specimen Description   Final    BLOOD RIGHT ANTECUBITAL Performed at [place] [address], [place], [place] [address]    Special Requests   Final    BOTTLES DRAWN AEROBIC AND ANAEROBIC Blood Culture adequate volume Performed at [place] [address], [place],  [address]    Culture  Setup Time   Final    GRAM NEGATIVE RODS ANAEROBIC BOTTLE ONLY CRITICAL VALUE NOTED.  VALUE IS CONSISTENT WITH PREVIOUSLY REPORTED AND CALLED VALUE.    Culture (A)  Final    PROTEUS MIRABILIS SUSCEPTIBILITIES PERFORMED ON PREVIOUS CULTURE  WITHIN THE LAST 5 DAYS. Performed at [facility], [place] [address] [place], [place] [address]    Report Status 05/05/2019 FINAL  Final  C difficile quick scan w PCR reflex     Status: Abnormal   Collection Time: 05/02/19 11:42 PM   Specimen: STOOL  Result Value Ref Range Status   C Diff antigen POSITIVE (A) NEGATIVE Final   C Diff toxin NEGATIVE NEGATIVE Final   C Diff interpretation Results are indeterminate. See PCR results.  Final    Comment: Performed at [facility], [place] [address] [place], [place] [address]  Stool culture (children & immunocomp patients)     Status: None (Preliminary result)   Collection Time: 05/02/19 11:42 PM   Specimen: Stool  Result Value Ref Range Status   Salmonella/Shigella Screen PENDING  Incomplete   Campylobacter Culture PENDING  Incomplete   E coli, Shiga toxin Assay Negative Negative Final    Comment: (NOTE) Performed At: [facility] [address] [place], [place] [id number] Rush Farmer MD ZL:[id number]   C. Diff by PCR, Reflexed     Status: Abnormal   Collection Time: 05/02/19 11:42 PM  Result Value Ref Range Status   Toxigenic C. Difficile by PCR POSITIVE (A)  NEGATIVE Final    Comment: Positive for toxigenic C. difficile with little to no toxin production. Only treat if clinical presentation suggests symptomatic illness. Performed at [facility], [place] [address] [place], [place] [address]     Studies/Results: Dg Fluoro Rm 1-60 Min - No Report  Result Date: 05/03/2019 Fluoroscopy was utilized by the requesting physician.  No radiographic interpretation.   Ct Renal Stone Study  Result Date: 05/03/2019 CLINICAL DATA:  Flank pain, abdominal pain, nausea, vomiting, acute on chronic diarrhea, pyelonephritis, ulcerative colitis EXAM: CT ABDOMEN AND PELVIS WITHOUT CONTRAST TECHNIQUE: Multidetector CT imaging of the abdomen and pelvis was performed following the standard protocol without IV contrast. COMPARISON:  03/28/2019 FINDINGS: Lower chest: Bibasilar scarring or atelectasis. Small hiatal hernia. Hepatobiliary: No focal liver abnormality is seen. Status post cholecystectomy. No biliary dilatation. Pancreas: Unremarkable. No pancreatic ductal dilatation or surrounding inflammatory changes. Spleen: Normal in size without significant abnormality. Adrenals/Urinary Tract: Adrenal glands are unremarkable. There is a 3 mm calculus of the proximal right ureter with mild right hydronephrosis and adjacent perinephric fat stranding. There is an additional punctuate nonobstructive calculus of the inferior pole of left kidney. Bladder is unremarkable. Stomach/Bowel: Adjustable gastric lap band and reservoir. Appendix appears normal. The colon is generally featureless and thickened appearing, particularly in the ascending and transverse colon. Vascular/Lymphatic: No significant vascular findings are present. No enlarged abdominal or pelvic lymph nodes. Reproductive: No mass or other significant abnormality. Other: No abdominal wall hernia or abnormality. No abdominopelvic ascites. Musculoskeletal: No acute or significant osseous findings. IMPRESSION: 1. There is a 3 mm  calculus of the proximal right ureter with mild right hydronephrosis and adjacent perinephric fat stranding. This is calculus is in unchanged position compared to prior CT dated 03/28/2019, however perinephric fat stranding is new, concerning for infection. There is an additional punctuate nonobstructive calculus of the inferior pole of left kidney. 2. The colon is generally featureless and thickened appearing, particularly in the ascending and transverse colon, in keeping with reported history of ulcerative colitis. 3. Other chronic, incidental, and postoperative findings as detailed above. Electronically Signed   By: Eddie Candle M.D.   On: 05/03/2019 16:48    Assessment/Plan: POD#2 right stent placement for a  right ureteral calculus and sepsis from a urinary source  1.d/c foley. The patient should be discharged with 14 days of culture specific antibiotics 2. She will be scheduled for outpatient management of her ureteral calculus pending resolution of her sepsis   LOS: 3 days   Joyce Kim 05/05/2019, 10:47 AM

## 2019-05-05 NOTE — Progress Notes (Signed)
Pt was lying in bed awake and alert when I arrived. She said she was feeling much better than when she was admitted. I offered listening support. 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 She said she was wondering if her kidney stone could be a result of the fact that she does not get to go to the restroom much during the day. 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 She enjoyed talking about her family and grandson. Pt was very cordial and did not need any specific support today. Please page if assistance needed later. [place]

## 2019-05-06 LAB — BASIC METABOLIC PANEL
Anion gap: 8 (ref 5–15)
BUN: 9 mg/dL (ref 6–20)
CO2: 29 mmol/L (ref 22–32)
Calcium: 8.4 mg/dL — ABNORMAL LOW (ref 8.9–10.3)
Chloride: 105 mmol/L (ref 98–111)
Creatinine, Ser: 0.9 mg/dL (ref 0.44–1.00)
GFR calc Af Amer: >60 mL/min (ref >60)
GFR calc non Af Amer: >60 mL/min (ref >60)
Glucose, Bld: 106 mg/dL — ABNORMAL HIGH (ref 70–99)
Potassium: 3 mmol/L — ABNORMAL LOW (ref 3.5–5.1)
Sodium: 142 mmol/L (ref 135–145)

## 2019-05-06 LAB — CBC
HCT: 39 % (ref 36.0–46.0)
Hemoglobin: 12 g/dL (ref 12.0–15.0)
MCH: 27.7 pg (ref 26.0–34.0)
MCHC: 30.8 g/dL (ref 30.0–36.0)
MCV: 90.1 fL (ref 80.0–100.0)
Platelets: 309 10*3/uL (ref 150–400)
RBC: 4.33 MIL/uL (ref 3.87–5.11)
RDW: 14.6 % (ref 11.5–15.5)
WBC: 9.7 10*3/uL (ref 4.0–10.5)
nRBC: 0 % (ref 0.0–0.2)

## 2019-05-06 MED ORDER — POTASSIUM CHLORIDE CRYS ER 20 MEQ PO TBCR
40.0000 meq | EXTENDED_RELEASE_TABLET | ORAL | Status: AC
  Administered 2019-05-06 (×2): 40 meq via ORAL
  Filled 2019-05-06 (×2): qty 2

## 2019-05-06 MED ORDER — IPRATROPIUM BROMIDE 0.02 % IN SOLN: 0.5000 mg | Freq: Four times a day (QID) | RESPIRATORY_TRACT | Status: DC | PRN

## 2019-05-06 NOTE — Progress Notes (Signed)
PROGRESS NOTE    Joyce Kim  PTW:[id number] DOB: 1961-01-09 DOA: 05/02/2019 PCP: Prince Solian, MD     Brief Narrative:  Joyce Kim is a 58 year old female with past medical history significant for ulcerative colitis, hypothyroidism, GERD, recent right kidney stone with hydronephrosis who was admitted on 05/02/2019 after presenting with complaint of abdominal pain, fever, diarrhea and generalized body aches.  She was found to have ulcerative colitis flareup, C. difficile as well as pyelonephritis.  GI as well as urology were consulted.  She underwent right ureteral stent placement due to right ureteral stone with mild hydroureteronephrosis and acute pyelonephrosis.  New events last 24 hours / Subjective: Fever yesterday up to 101.1.  Continues to have watery stools, they have now turned green but slowed down in frequency.  She has had about 3 episodes since last night.  No further pelvic pain since Foley catheter was removed.  Assessment & Plan:   Active Problems:   Pyelonephritis  C. difficile colitis, history of ulcerative colitis -C. difficile antigen positive, toxin negative but PCR was positive.  Patient was started on p.o. vancomycin -GI consulted; did not feel that her acute on chronic diarrhea appeared to be a ulcerative colitis flare.  Continue Lialda, dicyclomine.  Follow-up with Dr. Carlean Purl as an outpatient -Continue p.o. vancomycin  Sepsis secondary to acute pyelonephritis, POA  -Right ureteral stone with mild hydroureteronephrosis status post right ureteral stent placement -Follow-up with Dr. Karsten Ro, urology as an outpatient  -Foley catheter removed -Continue antibiotics for total of 2 weeks  Sepsis secondary to Proteus bacteremia, POA  -Sensitive to cephalosporin -Continue IV rocephin, plan to de-escalate to oral Keflex on discharge  Hypothyroidism -Continue Synthroid  Depression -Continue Lexapro  Morbid obesity -Status post lap  band -Body mass index is 43.74 kg/m.  Hypokalemia -Replace, trend  DVT prophylaxis: Lovenox Code Status: Full Family Communication: None Disposition Plan: Plan to discharge home 6/23 as long as no more fevers and diarrhea has slowed down   Consultants:   GI  Urology  Procedures:   S/p right ureteral stent placement   Antimicrobials:  Anti-infectives (From admission, onward)   Start     Dose/Rate Route Frequency Ordered Stop   05/04/19 1445  vancomycin (VANCOCIN) 50 mg/mL oral solution 125 mg     125 mg Oral 4 times daily 05/04/19 1433 05/14/19 1359   05/03/19 2200  cefTRIAXone (ROCEPHIN) 1 g in sodium chloride 0.9 % 100 mL IVPB  Status:  Discontinued     1 g 200 mL/hr over 30 Minutes Intravenous Every 24 hours 05/02/19 2341 05/03/19 1326   05/03/19 1400  cefTRIAXone (ROCEPHIN) 2 g in sodium chloride 0.9 % 100 mL IVPB     2 g 200 mL/hr over 30 Minutes Intravenous Every 24 hours 05/03/19 1326     05/03/19 0015  metroNIDAZOLE (FLAGYL) IVPB 500 mg  Status:  Discontinued     500 mg 100 mL/hr over 60 Minutes Intravenous Every 8 hours 05/03/19 0011 05/04/19 1429   05/02/19 2145  cefTRIAXone (ROCEPHIN) 1 g in sodium chloride 0.9 % 100 mL IVPB     1 g 200 mL/hr over 30 Minutes Intravenous  Once 05/02/19 2135 05/02/19 2236       Objective: Vitals:   05/05/19 1323 05/05/19 1727 05/05/19 2213 05/06/19 0656  BP: 128/72  123/64 (!) 148/83  Pulse: (!) 59  63 (!) 57  Resp: 18  18 16   Temp: 98.3 F (36.8 C) (!) 101.1 F (38.4 C)  98.4 F (36.9 C) 98.4 F (36.9 C)  TempSrc: Oral Oral Oral Oral  SpO2: 97%  (!) 89% 94%  Weight:      Height:        Intake/Output Summary (Last 24 hours) at 05/06/2019 1258 Last data filed at 05/06/2019 1059 Gross per 24 hour  Intake 820 ml  Output 2700 ml  Net -1880 ml   Filed Weights   05/02/19 1727  Weight: 122.9 kg    Examination: General exam: Appears calm and comfortable  Respiratory system: Clear to auscultation. Respiratory  effort normal. Cardiovascular system: S1 & S2 heard, RRR. No JVD, murmurs, rubs, gallops or clicks. No pedal edema. Gastrointestinal system: Abdomen is nondistended, soft and nontender. No organomegaly or masses felt. Normal bowel sounds heard. Central nervous system: Alert and oriented. No focal neurological deficits. Extremities: Symmetric 5 x 5 power. Skin: No rashes, lesions or ulcers Psychiatry: Judgement and insight appear normal. Mood & affect appropriate.   Data Reviewed: I have personally reviewed following labs and imaging studies  CBC: Recent Labs  Lab 05/02/19 1919 05/03/19 0336 05/04/19 0443 05/06/19 0340  WBC 11.7* 15.0* 9.7 9.7  NEUTROABS 9.4*  --  8.3*  --   HGB 13.3 12.5 11.8* 12.0  HCT 41.5 38.8 38.4 39.0  MCV 89.6 90.2 91.9 90.1  PLT 312 270 242 702   Basic Metabolic Panel: Recent Labs  Lab 05/02/19 1919 05/03/19 0029 05/03/19 0336 05/04/19 0443 05/06/19 0340  NA 141  --  139 139 142  K 3.0*  --  3.1* 3.8 3.0*  CL 104  --  107 105 105  CO2 25  --  21* 23 29  GLUCOSE 134*  --  150* 169* 106*  BUN 6  --  9 14 9   CREATININE 0.91  --  0.89 0.86 0.90  CALCIUM 8.6*  --  8.0* 8.3* 8.4*  MG 1.8 1.9  --  2.0  --   PHOS  --  3.3  --   --   --    GFR: Estimated Creatinine Clearance: 92.2 mL/min (by C-G formula based on SCr of 0.9 mg/dL). Liver Function Tests: Recent Labs  Lab 05/02/19 1919 05/04/19 0443  AST 21 28  ALT 19 23  ALKPHOS 88 71  BILITOT 0.9 0.8  PROT 6.9 6.1*  ALBUMIN 3.5 2.9*   No results for input(s): LIPASE, AMYLASE in the last 168 hours. No results for input(s): AMMONIA in the last 168 hours. Coagulation Profile: No results for input(s): INR, PROTIME in the last 168 hours. Cardiac Enzymes: No results for input(s): CKTOTAL, CKMB, CKMBINDEX, TROPONINI in the last 168 hours. BNP (last 3 results) No results for input(s): PROBNP in the last 8760 hours. HbA1C: No results for input(s): HGBA1C in the last 72 hours. CBG: Recent Labs   Lab 05/03/19 1018  GLUCAP 126*   Lipid Profile: No results for input(s): CHOL, HDL, LDLCALC, TRIG, CHOLHDL, LDLDIRECT in the last 72 hours. Thyroid Function Tests: No results for input(s): TSH, T4TOTAL, FREET4, T3FREE, THYROIDAB in the last 72 hours. Anemia Panel: No results for input(s): VITAMINB12, FOLATE, FERRITIN, TIBC, IRON, RETICCTPCT in the last 72 hours. Sepsis Labs: Recent Labs  Lab 05/02/19 1918  LATICACIDVEN 1.7    Recent Results (from the past 240 hour(s))  Urine culture     Status: Abnormal   Collection Time: 05/02/19  6:14 PM   Specimen: Urine, Clean Catch  Result Value Ref Range Status   Specimen Description   Final  URINE, CLEAN CATCH Performed at [facility], [place] [address] [place], [place] [address]    Special Requests   Final    NONE Performed at [facility], [place] [address], [place], [place] [address]    Culture >=100,000 COLONIES/mL PROTEUS MIRABILIS (A)  Final   Report Status 05/05/2019 FINAL  Final   Organism ID, Bacteria PROTEUS MIRABILIS (A)  Final      Susceptibility   Proteus mirabilis - MIC*    AMPICILLIN <=2 SENSITIVE Sensitive     CEFAZOLIN <=4 SENSITIVE Sensitive     CEFTRIAXONE <=1 SENSITIVE Sensitive     CIPROFLOXACIN <=0.25 SENSITIVE Sensitive     GENTAMICIN 2 SENSITIVE Sensitive     IMIPENEM 8 INTERMEDIATE Intermediate     NITROFURANTOIN 256 RESISTANT Resistant     TRIMETH/SULFA <=20 SENSITIVE Sensitive     AMPICILLIN/SULBACTAM <=2 SENSITIVE Sensitive     PIP/TAZO <=4 SENSITIVE Sensitive     * >=100,000 COLONIES/mL PROTEUS MIRABILIS  SARS Coronavirus 2 (CEPHEID- Performed in [place] hospital lab), Hosp Order     Status: None   Collection Time: 05/02/19  6:36 PM   Specimen: Nasopharyngeal Swab  Result Value Ref Range Status   SARS Coronavirus 2 NEGATIVE NEGATIVE Final    Comment: (NOTE) If result is NEGATIVE SARS-CoV-2 target nucleic acids are NOT DETECTED. The SARS-CoV-2 RNA is  generally detectable in upper and lower  respiratory specimens during the acute phase of infection. The lowest  concentration of SARS-CoV-2 viral copies this assay can detect is 250  copies / mL. A negative result does not preclude SARS-CoV-2 infection  and should not be used as the sole basis for treatment or other  patient management decisions.  A negative result may occur with  improper specimen collection / handling, submission of specimen other  than nasopharyngeal swab, presence of viral mutation(s) within the  areas targeted by this assay, and inadequate number of viral copies  (<250 copies / mL). A negative result must be combined with clinical  observations, patient history, and epidemiological information. If result is POSITIVE SARS-CoV-2 target nucleic acids are DETECTED. The SARS-CoV-2 RNA is generally detectable in upper and lower  respiratory specimens dur ing the acute phase of infection.  Positive  results are indicative of active infection with SARS-CoV-2.  Clinical  correlation with patient history and other diagnostic information is  necessary to determine patient infection status.  Positive results do  not rule out bacterial infection or co-infection with other viruses. If result is PRESUMPTIVE POSTIVE SARS-CoV-2 nucleic acids MAY BE PRESENT.   A presumptive positive result was obtained on the submitted specimen  and confirmed on repeat testing.  While 2019 novel coronavirus  (SARS-CoV-2) nucleic acids may be present in the submitted sample  additional confirmatory testing may be necessary for epidemiological  and / or clinical management purposes  to differentiate between  SARS-CoV-2 and other Sarbecovirus currently known to infect humans.  If clinically indicated additional testing with an alternate test  methodology [phone]) is advised. The SARS-CoV-2 RNA is generally  detectable in upper and lower respiratory sp ecimens during the acute  phase of infection.  The expected result is Negative. Fact Sheet for Patients:  StrictlyIdeas.no Fact Sheet for Healthcare Providers: BankingDealers.co.za This test is not yet approved or cleared by the [place] FDA and has been authorized for detection and/or diagnosis of SARS-CoV-2 by FDA under an Emergency Use Authorization (EUA).  This EUA will remain in effect (meaning this test  can be used) for the duration of the COVID-19 declaration under Section 564(b)(1) of the Act, 21 U.S.C. section 360bbb-3(b)(1), unless the authorization is terminated or revoked sooner. Performed at [facility], [place] [address] [place], [place] [address]   Gastrointestinal Panel by PCR , Stool     Status: None   Collection Time: 05/02/19  6:37 PM   Specimen: Stool  Result Value Ref Range Status   Campylobacter species NOT DETECTED NOT DETECTED Final   Plesimonas shigelloides NOT DETECTED NOT DETECTED Final   Salmonella species NOT DETECTED NOT DETECTED Final   Yersinia enterocolitica NOT DETECTED NOT DETECTED Final   Vibrio species NOT DETECTED NOT DETECTED Final   Vibrio cholerae NOT DETECTED NOT DETECTED Final   Enteroaggregative E coli (EAEC) NOT DETECTED NOT DETECTED Final   Enteropathogenic E coli (EPEC) NOT DETECTED NOT DETECTED Final   Enterotoxigenic E coli (ETEC) NOT DETECTED NOT DETECTED Final   Shiga like toxin producing E coli (STEC) NOT DETECTED NOT DETECTED Final   Shigella/Enteroinvasive E coli (EIEC) NOT DETECTED NOT DETECTED Final   Cryptosporidium NOT DETECTED NOT DETECTED Final   Cyclospora cayetanensis NOT DETECTED NOT DETECTED Final   Entamoeba histolytica NOT DETECTED NOT DETECTED Final   Giardia lamblia NOT DETECTED NOT DETECTED Final   Adenovirus F40/41 NOT DETECTED NOT DETECTED Final   Astrovirus NOT DETECTED NOT DETECTED Final   Norovirus GI/GII NOT DETECTED NOT DETECTED Final   Rotavirus A NOT DETECTED NOT DETECTED Final    Sapovirus (I, II, IV, and V) NOT DETECTED NOT DETECTED Final    Comment: Performed at [facility], [place] [place], [place] [address]  Culture, blood (routine x 2)     Status: Abnormal   Collection Time: 05/02/19  6:58 PM   Specimen: BLOOD  Result Value Ref Range Status   Specimen Description   Final    BLOOD LEFT ANTECUBITAL Performed at [facility], [place] [address], [place], [place] [address]    Special Requests   Final    BOTTLES DRAWN AEROBIC AND ANAEROBIC Blood Culture adequate volume Performed at [place] [address], [place], [place] [address]    Culture  Setup Time   Final    GRAM NEGATIVE RODS IN BOTH AEROBIC AND ANAEROBIC BOTTLES CRITICAL RESULT CALLED TO, READ BACK BY AND VERIFIED WITH: Chales Abrahams PharmD 13:15 05/03/19 ([email or web address]) Performed at [facility], [place] [address], [place], [place] [address]    Culture PROTEUS MIRABILIS (A)  Final   Report Status 05/05/2019 FINAL  Final   Organism ID, Bacteria PROTEUS MIRABILIS  Final      Susceptibility   Proteus mirabilis - MIC*    AMPICILLIN <=2 SENSITIVE Sensitive     CEFAZOLIN <=4 SENSITIVE Sensitive     CEFEPIME <=1 SENSITIVE Sensitive     CEFTAZIDIME <=1 SENSITIVE Sensitive     CEFTRIAXONE <=1 SENSITIVE Sensitive     CIPROFLOXACIN <=0.25 SENSITIVE Sensitive     GENTAMICIN 2 SENSITIVE Sensitive     IMIPENEM 4 SENSITIVE Sensitive     TRIMETH/SULFA <=20 SENSITIVE Sensitive     AMPICILLIN/SULBACTAM <=2 SENSITIVE Sensitive     PIP/TAZO <=4 SENSITIVE Sensitive     * PROTEUS MIRABILIS  Blood Culture ID Panel (Reflexed)     Status: Abnormal   Collection Time: 05/02/19  6:58 PM  Result Value Ref Range Status   Enterococcus species NOT DETECTED NOT DETECTED Final   Listeria monocytogenes NOT DETECTED NOT DETECTED  Final   Staphylococcus species NOT DETECTED NOT DETECTED Final   Staphylococcus aureus (BCID) NOT DETECTED NOT DETECTED Final   Streptococcus species  NOT DETECTED NOT DETECTED Final   Streptococcus agalactiae NOT DETECTED NOT DETECTED Final   Streptococcus pneumoniae NOT DETECTED NOT DETECTED Final   Streptococcus pyogenes NOT DETECTED NOT DETECTED Final   Acinetobacter baumannii NOT DETECTED NOT DETECTED Final   Enterobacteriaceae species DETECTED (A) NOT DETECTED Final    Comment: Enterobacteriaceae represent a large family of gram-negative bacteria, not a single organism. CRITICAL RESULT CALLED TO, READ BACK BY AND VERIFIED WITH: Chales Abrahams PharmD 13:15 05/03/19 ([email or web address])    Enterobacter cloacae complex NOT DETECTED NOT DETECTED Final   Escherichia coli NOT DETECTED NOT DETECTED Final   Klebsiella oxytoca NOT DETECTED NOT DETECTED Final   Klebsiella pneumoniae NOT DETECTED NOT DETECTED Final   Proteus species DETECTED (A) NOT DETECTED Final    Comment: CRITICAL RESULT CALLED TO, READ BACK BY AND VERIFIED WITH: Chales Abrahams PharmD 13:15 05/03/19 ([email or web address])    Serratia marcescens NOT DETECTED NOT DETECTED Final   Carbapenem resistance NOT DETECTED NOT DETECTED Final   Haemophilus influenzae NOT DETECTED NOT DETECTED Final   Neisseria meningitidis NOT DETECTED NOT DETECTED Final   Pseudomonas aeruginosa NOT DETECTED NOT DETECTED Final   Candida albicans NOT DETECTED NOT DETECTED Final   Candida glabrata NOT DETECTED NOT DETECTED Final   Candida krusei NOT DETECTED NOT DETECTED Final   Candida parapsilosis NOT DETECTED NOT DETECTED Final   Candida tropicalis NOT DETECTED NOT DETECTED Final    Comment: Performed at [facility], [place] [address], [place], [place] [address]  Culture, blood (routine x 2)     Status: Abnormal   Collection Time: 05/02/19  7:05 PM   Specimen: BLOOD  Result Value Ref Range Status   Specimen Description   Final    BLOOD RIGHT ANTECUBITAL Performed at [place] [address] [place], [place] [address]    Special Requests   Final    BOTTLES DRAWN AEROBIC AND ANAEROBIC Blood Culture  adequate volume Performed at [place] [address], [place], [place] [address]    Culture  Setup Time   Final    GRAM NEGATIVE RODS ANAEROBIC BOTTLE ONLY CRITICAL VALUE NOTED.  VALUE IS CONSISTENT WITH PREVIOUSLY REPORTED AND CALLED VALUE.    Culture (A)  Final    PROTEUS MIRABILIS SUSCEPTIBILITIES PERFORMED ON PREVIOUS CULTURE WITHIN THE LAST 5 DAYS. Performed at [facility], [place] [address], [place], [place] [address]    Report Status 05/05/2019 FINAL  Final  C difficile quick scan w PCR reflex     Status: Abnormal   Collection Time: 05/02/19 11:42 PM   Specimen: STOOL  Result Value Ref Range Status   C Diff antigen POSITIVE (A) NEGATIVE Final   C Diff toxin NEGATIVE NEGATIVE Final   C Diff interpretation Results are indeterminate. See PCR results.  Final    Comment: Performed at [facility], [place] [address], [place], [place] [address]  Stool culture (children & immunocomp patients)     Status: None (Preliminary result)   Collection Time: 05/02/19 11:42 PM   Specimen: Stool  Result Value Ref Range Status   Salmonella/Shigella Screen Preliminary report  Final   Campylobacter Culture PENDING  Incomplete   E coli, Shiga toxin Assay Negative Negative Final    Comment: (NOTE) Performed At: [place] [address] [place], [place] [id number] Perlie Gold  Derinda Late MD ZN:[id number]   C. Diff by PCR, Reflexed     Status: Abnormal   Collection Time: 05/02/19 11:42 PM  Result Value Ref Range Status   Toxigenic C. Difficile by PCR POSITIVE (A) NEGATIVE Final    Comment: Positive for toxigenic C. difficile with little to no toxin production. Only treat if clinical presentation suggests symptomatic illness. Performed at [facility], [place] [address], [place], [place] [address]   STOOL CULTURE REFLEX - RSASHR     Status: None   Collection Time: 05/02/19 11:42 PM  Result Value Ref Range Status   Stool Culture result 1 (RSASHR)  Comment  Final    Comment: (NOTE) Microbiological testing to rule out the presence of possible pathogens is in progress. Performed At: [facility] [place], [place] [id number] Rush Farmer MD KQ:[id number]        Radiology Studies: No results found.    Scheduled Meds: . dicyclomine  10 mg Oral TID AC & HS  . enoxaparin (LOVENOX) injection  40 mg Subcutaneous Q24H  . escitalopram  10 mg Oral Daily  . famotidine  20 mg Oral Daily  . levothyroxine  50 mcg Oral QAC breakfast  . mesalamine  2.4 g Oral BID WC  . potassium chloride  40 mEq Oral Q4H  . vancomycin  125 mg Oral QID   Continuous Infusions: . cefTRIAXone (ROCEPHIN)  IV Stopped (05/05/19 1448)     LOS: 4 days    Time spent: 25 minutes   Dessa Phi, DO [facility] [email or web address] 05/06/2019, 12:58 PM

## 2019-05-06 NOTE — Progress Notes (Signed)
Assessment: Right ureteral calculus -she has a right ureteral stent in place and is tolerating the stent well with no pain or irritative voiding symptoms.  Today we discussed the management of obstructing urinary stones. These options include observation with pain control, percutaneous nephrostomy tube, or cystoscopy with retrograde ureteral stent placement with possible ureteroscopy and laser lithotripsy.  We discussed the risks, benefits, alternatives and likelihood of achieving the patient's goals.  We discussed which options are relevant to this particular situation. We discussed the natural history of stones and the as well as the complications of untreated stones and the impact on quality of life without treatment as well as with each of the above listed treatments. We also discussed the efficacy of each treatment. With any of these management options I discussed the signs and symptoms of infection and the need for emergent treatment should these be experienced.   For observation I described the risks which include, but are not limited to, silent renal damage, life-threatening infection, need for emergent surgery, failure to pass stone, and pain.  For stent placement with or without ureteroscopy and lithotripsy I described the risks which include, but are not limited to, heart attack, stroke, pulmonary embolus, death, bleeding, infection, damage to contiguous structures, positioning injury, ureteral stricture, ureteral avulsion, ureteral injury, need for ureteral stent, inability to perform ureteroscopy, need for an interval procedure, inability to clear stone burden, stent discomfort and pain.  We discussed the fact that her stone is very small and quite faint and therefore lithotripsy would not be a good option for treatment of the stone.  Another option would be to remove the stent and allow the stone to pass spontaneously however there is risk in doing this that the stone may again occlude  the ureter and therefore I have recommended we proceed with ureteroscopic management.  I told her that she may or may not need to have a stent after the procedure.  This will be performed once she has completed a full course of antibiotics.  Urosepsis -she has grown Proteus from both her blood and urine.  She is on appropriate antibiotics.  She will need to be on further antibiotics upon discharge for a total of 2 weeks.   Plan:  1.  My office will contact her to schedule her right ureteroscopy. 2.  Continue oral antibiotics upon discharge.   Subjective: Patient reports no urologic complaints.  She is tolerating the stent and denies any flank pain.  She is also not having any irritative symptoms from the stent.  Objective: Vital signs in last 24 hours: Temp:  [98.3 F (36.8 C)-101.1 F (38.4 C)] 98.4 F (36.9 C) (06/22 0656) Pulse Rate:  [57-63] 57 (06/22 0656) Resp:  [16-18] 16 (06/22 0656) BP: (123-148)/(64-83) 148/83 (06/22 0656) SpO2:  [89 %-97 %] 94 % (06/22 0656)A  Intake/Output from previous day: 06/21 0701 - 06/22 0700 In: 700 [P.O.:600; IV Piggyback:100] Out: 1800 [Urine:1800] Intake/Output this shift: Total I/O In: 120 [P.O.:120] Out: 1100 [Urine:1100]  Past Medical History:  Diagnosis Date  . Allergy    seasonal  . Anemia   . Arthritis   . Barrett esophagus   . Blood transfusion abn reaction or complication, no procedure mishap 1968   after kidney surgery  . Blood transfusion without reported diagnosis   . DJD (degenerative joint disease)    fingers and ankle  . Dyspnea    vomiting  . GERD (gastroesophageal reflux disease)   . H. pylori infection   .  Heart murmur   . History of kidney stones   . Hypothyroidism   . Left sided ulcerative (chronic) colitis ([place]) 02/08/2019  . Obesity   . Onychomycosis   . Overactive bladder   . Plantar fasciitis   . RBBB   . Reflux   . Ulcer     Physical Exam:  General: Awake, alert and in no apparent distress  Lungs: Normal respiratory effort, chest expands symmetrically.  Abdomen: Soft, non-tender & non-distended.  Lab Results: Recent Labs    05/04/19 0443 05/06/19 0340  WBC 9.7 9.7  HGB 11.8* 12.0  HCT 38.4 39.0   BMET Recent Labs    05/04/19 0443 05/06/19 0340  NA 139 142  K 3.8 3.0*  CL 105 105  CO2 23 29  GLUCOSE 169* 106*  BUN 14 9  CREATININE 0.86 0.90  CALCIUM 8.3* 8.4*   No results for input(s): LABURIN in the last 72 hours. Results for orders placed or performed during the hospital encounter of 05/02/19  Urine culture     Status: Abnormal   Collection Time: 05/02/19  6:14 PM   Specimen: Urine, Clean Catch  Result Value Ref Range Status   Specimen Description   Final    URINE, CLEAN CATCH Performed at [facility], [place] [address], [place], [place] [address]    Special Requests   Final    NONE Performed at [facility], [place] [address], [place], [place] [address]    Culture >=100,000 COLONIES/mL PROTEUS MIRABILIS (A)  Final   Report Status 05/05/2019 FINAL  Final   Organism ID, Bacteria PROTEUS MIRABILIS (A)  Final      Susceptibility   Proteus mirabilis - MIC*    AMPICILLIN <=2 SENSITIVE Sensitive     CEFAZOLIN <=4 SENSITIVE Sensitive     CEFTRIAXONE <=1 SENSITIVE Sensitive     CIPROFLOXACIN <=0.25 SENSITIVE Sensitive     GENTAMICIN 2 SENSITIVE Sensitive     IMIPENEM 8 INTERMEDIATE Intermediate     NITROFURANTOIN 256 RESISTANT Resistant     TRIMETH/SULFA <=20 SENSITIVE Sensitive     AMPICILLIN/SULBACTAM <=2 SENSITIVE Sensitive     PIP/TAZO <=4 SENSITIVE Sensitive     * >=100,000 COLONIES/mL PROTEUS MIRABILIS  SARS Coronavirus 2 (CEPHEID- Performed in [facility] hospital lab), Hosp Order     Status: None   Collection Time: 05/02/19  6:36 PM   Specimen: Nasopharyngeal Swab  Result Value Ref Range Status   SARS Coronavirus 2 NEGATIVE NEGATIVE Final    Comment: (NOTE) If result is NEGATIVE SARS-CoV-2 target nucleic  acids are NOT DETECTED. The SARS-CoV-2 RNA is generally detectable in upper and lower  respiratory specimens during the acute phase of infection. The lowest  concentration of SARS-CoV-2 viral copies this assay can detect is 250  copies / mL. A negative result does not preclude SARS-CoV-2 infection  and should not be used as the sole basis for treatment or other  patient management decisions.  A negative result may occur with  improper specimen collection / handling, submission of specimen other  than nasopharyngeal swab, presence of viral mutation(s) within the  areas targeted by this assay, and inadequate number of viral copies  (<250 copies / mL). A negative result must be combined with clinical  observations, patient history, and epidemiological information. If result is POSITIVE SARS-CoV-2 target nucleic acids are DETECTED. The SARS-CoV-2 RNA is generally detectable in upper and lower  respiratory specimens dur ing the acute phase of infection.  Positive  results are indicative of active infection with SARS-CoV-2.  Clinical  correlation with patient history and other diagnostic information is  necessary to determine patient infection status.  Positive results do  not rule out bacterial infection or co-infection with other viruses. If result is PRESUMPTIVE POSTIVE SARS-CoV-2 nucleic acids MAY BE PRESENT.   A presumptive positive result was obtained on the submitted specimen  and confirmed on repeat testing.  While 2019 novel coronavirus  (SARS-CoV-2) nucleic acids may be present in the submitted sample  additional confirmatory testing may be necessary for epidemiological  and / or clinical management purposes  to differentiate between  SARS-CoV-2 and other Sarbecovirus currently known to infect humans.  If clinically indicated additional testing with an alternate test  methodology [phone]) is advised. The SARS-CoV-2 RNA is generally  detectable in upper and lower respiratory sp  ecimens during the acute  phase of infection. The expected result is Negative. Fact Sheet for Patients:  StrictlyIdeas.no Fact Sheet for Healthcare Providers: BankingDealers.co.za This test is not yet approved or cleared by the [place] FDA and has been authorized for detection and/or diagnosis of SARS-CoV-2 by FDA under an Emergency Use Authorization (EUA).  This EUA will remain in effect (meaning this test can be used) for the duration of the COVID-19 declaration under Section 564(b)(1) of the Act, 21 U.S.C. section 360bbb-3(b)(1), unless the authorization is terminated or revoked sooner. Performed at [facility], [place] [address], [place], [place] [address]   Gastrointestinal Panel by PCR , Stool     Status: None   Collection Time: 05/02/19  6:37 PM   Specimen: Stool  Result Value Ref Range Status   Campylobacter species NOT DETECTED NOT DETECTED Final   Plesimonas shigelloides NOT DETECTED NOT DETECTED Final   Salmonella species NOT DETECTED NOT DETECTED Final   Yersinia enterocolitica NOT DETECTED NOT DETECTED Final   Vibrio species NOT DETECTED NOT DETECTED Final   Vibrio cholerae NOT DETECTED NOT DETECTED Final   Enteroaggregative E coli (EAEC) NOT DETECTED NOT DETECTED Final   Enteropathogenic E coli (EPEC) NOT DETECTED NOT DETECTED Final   Enterotoxigenic E coli (ETEC) NOT DETECTED NOT DETECTED Final   Shiga like toxin producing E coli (STEC) NOT DETECTED NOT DETECTED Final   Shigella/Enteroinvasive E coli (EIEC) NOT DETECTED NOT DETECTED Final   Cryptosporidium NOT DETECTED NOT DETECTED Final   Cyclospora cayetanensis NOT DETECTED NOT DETECTED Final   Entamoeba histolytica NOT DETECTED NOT DETECTED Final   Giardia lamblia NOT DETECTED NOT DETECTED Final   Adenovirus F40/41 NOT DETECTED NOT DETECTED Final   Astrovirus NOT DETECTED NOT DETECTED Final   Norovirus GI/GII NOT DETECTED NOT DETECTED Final    Rotavirus A NOT DETECTED NOT DETECTED Final   Sapovirus (I, II, IV, and V) NOT DETECTED NOT DETECTED Final    Comment: Performed at [facility], [address] [place], [place] [address]  Culture, blood (routine x 2)     Status: Abnormal   Collection Time: 05/02/19  6:58 PM   Specimen: BLOOD  Result Value Ref Range Status   Specimen Description   Final    BLOOD LEFT ANTECUBITAL Performed at [facility], [place] [address] [place], [place] [address]    Special Requests   Final    BOTTLES DRAWN AEROBIC AND ANAEROBIC Blood Culture adequate volume Performed at [place] [address] [place], [place] [address]    Culture  Setup Time   Final    Lonell Grandchild  NEGATIVE RODS IN BOTH AEROBIC AND ANAEROBIC BOTTLES CRITICAL RESULT CALLED TO, READ BACK BY AND VERIFIED WITH: Chales Abrahams PharmD 13:15 05/03/19 ([email or web address]) Performed at [facility], [place] [address] [place], [place] [address]    Culture PROTEUS MIRABILIS (A)  Final   Report Status 05/05/2019 FINAL  Final   Organism ID, Bacteria PROTEUS MIRABILIS  Final      Susceptibility   Proteus mirabilis - MIC*    AMPICILLIN <=2 SENSITIVE Sensitive     CEFAZOLIN <=4 SENSITIVE Sensitive     CEFEPIME <=1 SENSITIVE Sensitive     CEFTAZIDIME <=1 SENSITIVE Sensitive     CEFTRIAXONE <=1 SENSITIVE Sensitive     CIPROFLOXACIN <=0.25 SENSITIVE Sensitive     GENTAMICIN 2 SENSITIVE Sensitive     IMIPENEM 4 SENSITIVE Sensitive     TRIMETH/SULFA <=20 SENSITIVE Sensitive     AMPICILLIN/SULBACTAM <=2 SENSITIVE Sensitive     PIP/TAZO <=4 SENSITIVE Sensitive     * PROTEUS MIRABILIS  Blood Culture ID Panel (Reflexed)     Status: Abnormal   Collection Time: 05/02/19  6:58 PM  Result Value Ref Range Status   Enterococcus species NOT DETECTED NOT DETECTED Final   Listeria monocytogenes NOT DETECTED NOT DETECTED Final   Staphylococcus species NOT DETECTED NOT DETECTED Final   Staphylococcus aureus (BCID) NOT  DETECTED NOT DETECTED Final   Streptococcus species NOT DETECTED NOT DETECTED Final   Streptococcus agalactiae NOT DETECTED NOT DETECTED Final   Streptococcus pneumoniae NOT DETECTED NOT DETECTED Final   Streptococcus pyogenes NOT DETECTED NOT DETECTED Final   Acinetobacter baumannii NOT DETECTED NOT DETECTED Final   Enterobacteriaceae species DETECTED (A) NOT DETECTED Final    Comment: Enterobacteriaceae represent a large family of gram-negative bacteria, not a single organism. CRITICAL RESULT CALLED TO, READ BACK BY AND VERIFIED WITH: Chales Abrahams PharmD 13:15 05/03/19 ([email or web address])    Enterobacter cloacae complex NOT DETECTED NOT DETECTED Final   Escherichia coli NOT DETECTED NOT DETECTED Final   Klebsiella oxytoca NOT DETECTED NOT DETECTED Final   Klebsiella pneumoniae NOT DETECTED NOT DETECTED Final   Proteus species DETECTED (A) NOT DETECTED Final    Comment: CRITICAL RESULT CALLED TO, READ BACK BY AND VERIFIED WITH: Chales Abrahams PharmD 13:15 05/03/19 ([email or web address])    Serratia marcescens NOT DETECTED NOT DETECTED Final   Carbapenem resistance NOT DETECTED NOT DETECTED Final   Haemophilus influenzae NOT DETECTED NOT DETECTED Final   Neisseria meningitidis NOT DETECTED NOT DETECTED Final   Pseudomonas aeruginosa NOT DETECTED NOT DETECTED Final   Candida albicans NOT DETECTED NOT DETECTED Final   Candida glabrata NOT DETECTED NOT DETECTED Final   Candida krusei NOT DETECTED NOT DETECTED Final   Candida parapsilosis NOT DETECTED NOT DETECTED Final   Candida tropicalis NOT DETECTED NOT DETECTED Final    Comment: Performed at [facility], [place] [address], [place], [place] [address]  Culture, blood (routine x 2)     Status: Abnormal   Collection Time: 05/02/19  7:05 PM   Specimen: BLOOD  Result Value Ref Range Status   Specimen Description   Final    BLOOD RIGHT ANTECUBITAL Performed at [place] [address] [place], [place] [address]    Special Requests   Final     BOTTLES DRAWN AEROBIC AND ANAEROBIC Blood Culture adequate volume Performed at [place] [address] [place], [place] [address]    Culture  Setup Time   Final    GRAM NEGATIVE RODS  ANAEROBIC BOTTLE ONLY CRITICAL VALUE NOTED.  VALUE IS CONSISTENT WITH PREVIOUSLY REPORTED AND CALLED VALUE.    Culture (A)  Final    PROTEUS MIRABILIS SUSCEPTIBILITIES PERFORMED ON PREVIOUS CULTURE WITHIN THE LAST 5 DAYS. Performed at [facility], [place] [address], [place], [place] [address]    Report Status 05/05/2019 FINAL  Final  C difficile quick scan w PCR reflex     Status: Abnormal   Collection Time: 05/02/19 11:42 PM   Specimen: STOOL  Result Value Ref Range Status   C Diff antigen POSITIVE (A) NEGATIVE Final   C Diff toxin NEGATIVE NEGATIVE Final   C Diff interpretation Results are indeterminate. See PCR results.  Final    Comment: Performed at [facility], [place] [address], [place], [place] [address]  Stool culture (children & immunocomp patients)     Status: None (Preliminary result)   Collection Time: 05/02/19 11:42 PM   Specimen: Stool  Result Value Ref Range Status   Salmonella/Shigella Screen Preliminary report  Final   Campylobacter Culture PENDING  Incomplete   E coli, Shiga toxin Assay Negative Negative Final    Comment: (NOTE) Performed At: [facility] [address] [place], [place] [id number] Rush Farmer MD AT:[id number]   C. Diff by PCR, Reflexed     Status: Abnormal   Collection Time: 05/02/19 11:42 PM  Result Value Ref Range Status   Toxigenic C. Difficile by PCR POSITIVE (A) NEGATIVE Final    Comment: Positive for toxigenic C. difficile with little to no toxin production. Only treat if clinical presentation suggests symptomatic illness. Performed at [facility], [place] [address] [place], [place] [address]   STOOL CULTURE REFLEX - RSASHR     Status: None   Collection Time: 05/02/19 11:42 PM  Result Value  Ref Range Status   Stool Culture result 1 (RSASHR) Comment  Final    Comment: (NOTE) Microbiological testing to rule out the presence of possible pathogens is in progress. Performed At: [facility] [place], [place] [id number] Rush Farmer MD EQ:[id number]     Studies/Results: No results found.    Claybon Jabs 05/06/2019, 11:32 AM

## 2019-05-07 ENCOUNTER — Telehealth: Payer: Self-pay | Admitting: Internal Medicine

## 2019-05-07 LAB — BASIC METABOLIC PANEL
Anion gap: 7 (ref 5–15)
BUN: 5 mg/dL — ABNORMAL LOW (ref 6–20)
CO2: 29 mmol/L (ref 22–32)
Calcium: 8.4 mg/dL — ABNORMAL LOW (ref 8.9–10.3)
Chloride: 104 mmol/L (ref 98–111)
Creatinine, Ser: 0.8 mg/dL (ref 0.44–1.00)
GFR calc Af Amer: >60 mL/min (ref >60)
GFR calc non Af Amer: >60 mL/min (ref >60)
Glucose, Bld: 100 mg/dL — ABNORMAL HIGH (ref 70–99)
Potassium: 3.2 mmol/L — ABNORMAL LOW (ref 3.5–5.1)
Sodium: 140 mmol/L (ref 135–145)

## 2019-05-07 LAB — STOOL CULTURE REFLEX - CMPCXR

## 2019-05-07 LAB — CBC
HCT: 37.9 % (ref 36.0–46.0)
Hemoglobin: 11.7 g/dL — ABNORMAL LOW (ref 12.0–15.0)
MCH: 27.7 pg (ref 26.0–34.0)
MCHC: 30.9 g/dL (ref 30.0–36.0)
MCV: 89.6 fL (ref 80.0–100.0)
Platelets: 315 10*3/uL (ref 150–400)
RBC: 4.23 MIL/uL (ref 3.87–5.11)
RDW: 14.7 % (ref 11.5–15.5)
WBC: 8.3 10*3/uL (ref 4.0–10.5)
nRBC: 0 % (ref 0.0–0.2)

## 2019-05-07 LAB — STOOL CULTURE: E coli, Shiga toxin Assay: NEGATIVE

## 2019-05-07 LAB — STOOL CULTURE REFLEX - RSASHR

## 2019-05-07 MED ORDER — METRONIDAZOLE IN NACL 5-0.79 MG/ML-% IV SOLN
500.0000 mg | Freq: Three times a day (TID) | INTRAVENOUS | Status: DC
  Administered 2019-05-07 – 2019-05-08 (×3): 500 mg via INTRAVENOUS
  Filled 2019-05-07 (×3): qty 100

## 2019-05-07 MED ORDER — DIPHENHYDRAMINE HCL 50 MG/ML IJ SOLN
25.0000 mg | Freq: Four times a day (QID) | INTRAMUSCULAR | Status: DC | PRN
  Administered 2019-05-07 (×2): 25 mg via INTRAVENOUS
  Filled 2019-05-07 (×2): qty 1

## 2019-05-07 MED ORDER — POTASSIUM CHLORIDE CRYS ER 20 MEQ PO TBCR
40.0000 meq | EXTENDED_RELEASE_TABLET | ORAL | Status: AC
  Administered 2019-05-07 (×2): 40 meq via ORAL
  Filled 2019-05-07 (×2): qty 2

## 2019-05-07 MED ORDER — CEFAZOLIN SODIUM-DEXTROSE 2-4 GM/100ML-% IV SOLN
2.0000 g | Freq: Three times a day (TID) | INTRAVENOUS | Status: DC
  Administered 2019-05-08: 2 g via INTRAVENOUS
  Filled 2019-05-07 (×3): qty 100

## 2019-05-07 MED ORDER — HYDROCORTISONE 1 % EX CREA
TOPICAL_CREAM | Freq: Four times a day (QID) | CUTANEOUS | Status: DC
  Administered 2019-05-07: 17:00:00 via TOPICAL
  Administered 2019-05-07: 1 via TOPICAL
  Administered 2019-05-07 – 2019-05-08 (×4): via TOPICAL
  Filled 2019-05-07: qty 28

## 2019-05-07 MED ORDER — FAMOTIDINE IN NACL 20-0.9 MG/50ML-% IV SOLN
20.0000 mg | Freq: Two times a day (BID) | INTRAVENOUS | Status: DC
  Administered 2019-05-07 – 2019-05-08 (×3): 20 mg via INTRAVENOUS
  Filled 2019-05-07 (×3): qty 50

## 2019-05-07 NOTE — Telephone Encounter (Signed)
Patient wanted to follow up with the doctor she stated she still having diarrhea and is in the hospital.

## 2019-05-07 NOTE — Progress Notes (Signed)
Blood and urine cx grew out proteus but she also has active c.diff. Ceftriaxone is a gut secreted abx so discussed with Dr. Maylene Roes about optimizing to cefazolin. Consider treating bacteremia for a 10d course. Today is D5 of ceftriaxone. Also consider extending PO vanc 7d past the bacteremia course.  Onnie Boer, PharmD, BCIDP, AAHIVP, CPP Infectious Disease Pharmacist 05/07/2019 1:58 PM

## 2019-05-07 NOTE — Progress Notes (Signed)
PROGRESS NOTE    Joyce Kim  YFV:[id number] DOB: 1961-01-26 DOA: 05/02/2019 PCP: Prince Solian, MD     Brief Narrative:  Joyce Kim is a 58 year old female with past medical history significant for ulcerative colitis, hypothyroidism, GERD, recent right kidney stone with hydronephrosis who was admitted on 05/02/2019 after presenting with complaint of abdominal pain, fever, diarrhea and generalized body aches.  She was found to have ulcerative colitis flareup, C. difficile as well as pyelonephritis.  GI as well as urology were consulted.  She underwent right ureteral stent placement due to right ureteral stone with mild hydroureteronephrosis and acute pyelonephrosis.  New events last 24 hours / Subjective: No fever last night, started to have a rash diffusely, over her right eye, bilateral upper extremities, worst in her upper thighs and her groin area.  Complains of itchiness.  Also complains of some epigastric abdominal pain that is squeezing and twisting in nature.  This is not associated with her bowel movements.  She had about 4 loose bowel movements this morning, green and algae-like.  Assessment & Plan:   Active Problems:   Pyelonephritis  C. difficile colitis, history of ulcerative colitis -C. difficile antigen positive, toxin negative but PCR was positive.  Patient was started on p.o. vancomycin -GI consulted; did not feel that her acute on chronic diarrhea appeared to be a ulcerative colitis flare.  Continue Lialda, dicyclomine.  Follow-up with Dr. Carlean Purl as an outpatient -Continue p.o. vancomycin   Sepsis secondary to acute pyelonephritis, POA  -Right ureteral stone with mild hydroureteronephrosis status post right ureteral stent placement -Follow-up with Dr. Karsten Ro, urology as an outpatient  -Foley catheter removed -Continue antibiotics for total of 2 weeks  Sepsis secondary to Proteus bacteremia, POA  -Sensitive to cephalosporin -Continue IV  rocephin, plan to de-escalate to oral Keflex on discharge  Hypothyroidism -Continue Synthroid  Depression -Continue Lexapro  Morbid obesity -Status post lap band -Body mass index is 43.74 kg/m.  Hypokalemia -Replace, trend  Rash -Doubt antibiotic related as patient has been on antibiotic treatments for 4 days.  Only new medication prior to rash was potassium supplementation.  Trial IV Benadryl, Pepcid.  Instructed patient to shower today, change her gown and bed sheets   DVT prophylaxis: Lovenox Code Status: Full Family Communication: None Disposition Plan: Plan to discharge home 6/24 if afebrile and rash improved   Consultants:   GI  Urology  Procedures:   S/p right ureteral stent placement   Antimicrobials:  Anti-infectives (From admission, onward)   Start     Dose/Rate Route Frequency Ordered Stop   05/04/19 1445  vancomycin (VANCOCIN) 50 mg/mL oral solution 125 mg     125 mg Oral 4 times daily 05/04/19 1433 05/14/19 1359   05/03/19 2200  cefTRIAXone (ROCEPHIN) 1 g in sodium chloride 0.9 % 100 mL IVPB  Status:  Discontinued     1 g 200 mL/hr over 30 Minutes Intravenous Every 24 hours 05/02/19 2341 05/03/19 1326   05/03/19 1400  cefTRIAXone (ROCEPHIN) 2 g in sodium chloride 0.9 % 100 mL IVPB     2 g 200 mL/hr over 30 Minutes Intravenous Every 24 hours 05/03/19 1326     05/03/19 0015  metroNIDAZOLE (FLAGYL) IVPB 500 mg  Status:  Discontinued     500 mg 100 mL/hr over 60 Minutes Intravenous Every 8 hours 05/03/19 0011 05/04/19 1429   05/02/19 2145  cefTRIAXone (ROCEPHIN) 1 g in sodium chloride 0.9 % 100 mL IVPB  1 g 200 mL/hr over 30 Minutes Intravenous  Once 05/02/19 2135 05/02/19 2236       Objective: Vitals:   05/06/19 0656 05/06/19 1323 05/06/19 2019 05/07/19 0646  BP: (!) 148/83 (!) 143/75 133/79 134/64  Pulse: (!) 57 (!) 56 61 (!) 59  Resp: 16 16 18 18   Temp: 98.4 F (36.9 C) 99.1 F (37.3 C) 98.7 F (37.1 C) 98.4 F (36.9 C)  TempSrc: Oral  Oral Oral Oral  SpO2: 94% 95% 91% 95%  Weight:      Height:        Intake/Output Summary (Last 24 hours) at 05/07/2019 1054 Last data filed at 05/07/2019 1018 Gross per 24 hour  Intake 1410 ml  Output 4605 ml  Net -3195 ml   Filed Weights   05/02/19 1727  Weight: 122.9 kg    Examination: General exam: Appears calm and comfortable  Respiratory system: Clear to auscultation. Respiratory effort normal. Cardiovascular system: S1 & S2 heard, RRR. No JVD, murmurs, rubs, gallops or clicks. No pedal edema. Gastrointestinal system: Abdomen is nondistended, soft and nontender. No organomegaly or masses felt. Normal bowel sounds heard. Central nervous system: Alert and oriented. No focal neurological deficits. Extremities: Symmetric 5 x 5 power. Skin: +Hives upper inner thighs, bilateral UE, right eye, none seen on abdomen or back  Psychiatry: Judgement and insight appear normal. Mood & affect appropriate.    Data Reviewed: I have personally reviewed following labs and imaging studies  CBC: Recent Labs  Lab 05/02/19 1919 05/03/19 0336 05/04/19 0443 05/06/19 0340 05/07/19 0447  WBC 11.7* 15.0* 9.7 9.7 8.3  NEUTROABS 9.4*  --  8.3*  --   --   HGB 13.3 12.5 11.8* 12.0 11.7*  HCT 41.5 38.8 38.4 39.0 37.9  MCV 89.6 90.2 91.9 90.1 89.6  PLT 312 270 242 309 620   Basic Metabolic Panel: Recent Labs  Lab 05/02/19 1919 05/03/19 0029 05/03/19 0336 05/04/19 0443 05/06/19 0340 05/07/19 0447  NA 141  --  139 139 142 140  K 3.0*  --  3.1* 3.8 3.0* 3.2*  CL 104  --  107 105 105 104  CO2 25  --  21* 23 29 29   GLUCOSE 134*  --  150* 169* 106* 100*  BUN 6  --  9 14 9  5*  CREATININE 0.91  --  0.89 0.86 0.90 0.80  CALCIUM 8.6*  --  8.0* 8.3* 8.4* 8.4*  MG 1.8 1.9  --  2.0  --   --   PHOS  --  3.3  --   --   --   --    GFR: Estimated Creatinine Clearance: 103.7 mL/min (by C-G formula based on SCr of 0.8 mg/dL). Liver Function Tests: Recent Labs  Lab 05/02/19 1919 05/04/19 0443   AST 21 28  ALT 19 23  ALKPHOS 88 71  BILITOT 0.9 0.8  PROT 6.9 6.1*  ALBUMIN 3.5 2.9*   No results for input(s): LIPASE, AMYLASE in the last 168 hours. No results for input(s): AMMONIA in the last 168 hours. Coagulation Profile: No results for input(s): INR, PROTIME in the last 168 hours. Cardiac Enzymes: No results for input(s): CKTOTAL, CKMB, CKMBINDEX, TROPONINI in the last 168 hours. BNP (last 3 results) No results for input(s): PROBNP in the last 8760 hours. HbA1C: No results for input(s): HGBA1C in the last 72 hours. CBG: Recent Labs  Lab 05/03/19 1018  GLUCAP 126*   Lipid Profile: No results for input(s): CHOL, HDL, LDLCALC, TRIG,  CHOLHDL, LDLDIRECT in the last 72 hours. Thyroid Function Tests: No results for input(s): TSH, T4TOTAL, FREET4, T3FREE, THYROIDAB in the last 72 hours. Anemia Panel: No results for input(s): VITAMINB12, FOLATE, FERRITIN, TIBC, IRON, RETICCTPCT in the last 72 hours. Sepsis Labs: Recent Labs  Lab 05/02/19 1918  LATICACIDVEN 1.7    Recent Results (from the past 240 hour(s))  Urine culture     Status: Abnormal   Collection Time: 05/02/19  6:14 PM   Specimen: Urine, Clean Catch  Result Value Ref Range Status   Specimen Description   Final    URINE, CLEAN CATCH Performed at [facility], [place] [address] [place], [place] [address]    Special Requests   Final    NONE Performed at [facility], [place] [address], [place],  [address]    Culture >=100,000 COLONIES/mL PROTEUS MIRABILIS (A)  Final   Report Status 05/05/2019 FINAL  Final   Organism ID, Bacteria PROTEUS MIRABILIS (A)  Final      Susceptibility   Proteus mirabilis - MIC*    AMPICILLIN <=2 SENSITIVE Sensitive     CEFAZOLIN <=4 SENSITIVE Sensitive     CEFTRIAXONE <=1 SENSITIVE Sensitive     CIPROFLOXACIN <=0.25 SENSITIVE Sensitive     GENTAMICIN 2 SENSITIVE Sensitive     IMIPENEM 8 INTERMEDIATE Intermediate     NITROFURANTOIN 256  RESISTANT Resistant     TRIMETH/SULFA <=20 SENSITIVE Sensitive     AMPICILLIN/SULBACTAM <=2 SENSITIVE Sensitive     PIP/TAZO <=4 SENSITIVE Sensitive     * >=100,000 COLONIES/mL PROTEUS MIRABILIS  SARS Coronavirus 2 (CEPHEID- Performed in [place] hospital lab), Hosp Order     Status: None   Collection Time: 05/02/19  6:36 PM   Specimen: Nasopharyngeal Swab  Result Value Ref Range Status   SARS Coronavirus 2 NEGATIVE NEGATIVE Final    Comment: (NOTE) If result is NEGATIVE SARS-CoV-2 target nucleic acids are NOT DETECTED. The SARS-CoV-2 RNA is generally detectable in upper and lower  respiratory specimens during the acute phase of infection. The lowest  concentration of SARS-CoV-2 viral copies this assay can detect is 250  copies / mL. A negative result does not preclude SARS-CoV-2 infection  and should not be used as the sole basis for treatment or other  patient management decisions.  A negative result may occur with  improper specimen collection / handling, submission of specimen other  than nasopharyngeal swab, presence of viral mutation(s) within the  areas targeted by this assay, and inadequate number of viral copies  (<250 copies / mL). A negative result must be combined with clinical  observations, patient history, and epidemiological information. If result is POSITIVE SARS-CoV-2 target nucleic acids are DETECTED. The SARS-CoV-2 RNA is generally detectable in upper and lower  respiratory specimens dur ing the acute phase of infection.  Positive  results are indicative of active infection with SARS-CoV-2.  Clinical  correlation with patient history and other diagnostic information is  necessary to determine patient infection status.  Positive results do  not rule out bacterial infection or co-infection with other viruses. If result is PRESUMPTIVE POSTIVE SARS-CoV-2 nucleic acids MAY BE PRESENT.   A presumptive positive result was obtained on the submitted specimen  and  confirmed on repeat testing.  While 2019 novel coronavirus  (SARS-CoV-2) nucleic acids may be present in the submitted sample  additional confirmatory testing may be necessary for epidemiological  and / or clinical management purposes  to differentiate between  SARS-CoV-2 and  other Sarbecovirus currently known to infect humans.  If clinically indicated additional testing with an alternate test  methodology [phone]) is advised. The SARS-CoV-2 RNA is generally  detectable in upper and lower respiratory sp ecimens during the acute  phase of infection. The expected result is Negative. Fact Sheet for Patients:  StrictlyIdeas.no Fact Sheet for Healthcare Providers: BankingDealers.co.za This test is not yet approved or cleared by the [place] FDA and has been authorized for detection and/or diagnosis of SARS-CoV-2 by FDA under an Emergency Use Authorization (EUA).  This EUA will remain in effect (meaning this test can be used) for the duration of the COVID-19 declaration under Section 564(b)(1) of the Act, 21 U.S.C. section 360bbb-3(b)(1), unless the authorization is terminated or revoked sooner. Performed at [facility], [place] [address] [place], [place] [address]   Gastrointestinal Panel by PCR , Stool     Status: None   Collection Time: 05/02/19  6:37 PM   Specimen: Stool  Result Value Ref Range Status   Campylobacter species NOT DETECTED NOT DETECTED Final   Plesimonas shigelloides NOT DETECTED NOT DETECTED Final   Salmonella species NOT DETECTED NOT DETECTED Final   Yersinia enterocolitica NOT DETECTED NOT DETECTED Final   Vibrio species NOT DETECTED NOT DETECTED Final   Vibrio cholerae NOT DETECTED NOT DETECTED Final   Enteroaggregative E coli (EAEC) NOT DETECTED NOT DETECTED Final   Enteropathogenic E coli (EPEC) NOT DETECTED NOT DETECTED Final   Enterotoxigenic E coli (ETEC) NOT DETECTED NOT DETECTED Final    Shiga like toxin producing E coli (STEC) NOT DETECTED NOT DETECTED Final   Shigella/Enteroinvasive E coli (EIEC) NOT DETECTED NOT DETECTED Final   Cryptosporidium NOT DETECTED NOT DETECTED Final   Cyclospora cayetanensis NOT DETECTED NOT DETECTED Final   Entamoeba histolytica NOT DETECTED NOT DETECTED Final   Giardia lamblia NOT DETECTED NOT DETECTED Final   Adenovirus F40/41 NOT DETECTED NOT DETECTED Final   Astrovirus NOT DETECTED NOT DETECTED Final   Norovirus GI/GII NOT DETECTED NOT DETECTED Final   Rotavirus A NOT DETECTED NOT DETECTED Final   Sapovirus (I, II, IV, and V) NOT DETECTED NOT DETECTED Final    Comment: Performed at [facility], [place] [place], [place] [address]  Culture, blood (routine x 2)     Status: Abnormal   Collection Time: 05/02/19  6:58 PM   Specimen: BLOOD  Result Value Ref Range Status   Specimen Description   Final    BLOOD LEFT ANTECUBITAL Performed at [facility], [place] [address] [place], [place] [address]    Special Requests   Final    BOTTLES DRAWN AEROBIC AND ANAEROBIC Blood Culture adequate volume Performed at [facility] [address], [place],  [address]    Culture  Setup Time   Final    GRAM NEGATIVE RODS IN BOTH AEROBIC AND ANAEROBIC BOTTLES CRITICAL RESULT CALLED TO, READ BACK BY AND VERIFIED WITH: Chales Abrahams PharmD 13:15 05/03/19 ([email or web address]) Performed at [facility], [place] [address], [place], [place] [address]    Culture PROTEUS MIRABILIS (A)  Final   Report Status 05/05/2019 FINAL  Final   Organism ID, Bacteria PROTEUS MIRABILIS  Final      Susceptibility   Proteus mirabilis - MIC*    AMPICILLIN <=2 SENSITIVE Sensitive     CEFAZOLIN <=4 SENSITIVE Sensitive     CEFEPIME <=1 SENSITIVE Sensitive     CEFTAZIDIME <=1 SENSITIVE Sensitive     CEFTRIAXONE <=  1 SENSITIVE Sensitive     CIPROFLOXACIN <=0.25 SENSITIVE Sensitive     GENTAMICIN 2 SENSITIVE Sensitive     IMIPENEM 4  SENSITIVE Sensitive     TRIMETH/SULFA <=20 SENSITIVE Sensitive     AMPICILLIN/SULBACTAM <=2 SENSITIVE Sensitive     PIP/TAZO <=4 SENSITIVE Sensitive     * PROTEUS MIRABILIS  Blood Culture ID Panel (Reflexed)     Status: Abnormal   Collection Time: 05/02/19  6:58 PM  Result Value Ref Range Status   Enterococcus species NOT DETECTED NOT DETECTED Final   Listeria monocytogenes NOT DETECTED NOT DETECTED Final   Staphylococcus species NOT DETECTED NOT DETECTED Final   Staphylococcus aureus (BCID) NOT DETECTED NOT DETECTED Final   Streptococcus species NOT DETECTED NOT DETECTED Final   Streptococcus agalactiae NOT DETECTED NOT DETECTED Final   Streptococcus pneumoniae NOT DETECTED NOT DETECTED Final   Streptococcus pyogenes NOT DETECTED NOT DETECTED Final   Acinetobacter baumannii NOT DETECTED NOT DETECTED Final   Enterobacteriaceae species DETECTED (A) NOT DETECTED Final    Comment: Enterobacteriaceae represent a large family of gram-negative bacteria, not a single organism. CRITICAL RESULT CALLED TO, READ BACK BY AND VERIFIED WITH: Chales Abrahams PharmD 13:15 05/03/19 ([email or web address])    Enterobacter cloacae complex NOT DETECTED NOT DETECTED Final   Escherichia coli NOT DETECTED NOT DETECTED Final   Klebsiella oxytoca NOT DETECTED NOT DETECTED Final   Klebsiella pneumoniae NOT DETECTED NOT DETECTED Final   Proteus species DETECTED (A) NOT DETECTED Final    Comment: CRITICAL RESULT CALLED TO, READ BACK BY AND VERIFIED WITH: Chales Abrahams PharmD 13:15 05/03/19 ([email or web address])    Serratia marcescens NOT DETECTED NOT DETECTED Final   Carbapenem resistance NOT DETECTED NOT DETECTED Final   Haemophilus influenzae NOT DETECTED NOT DETECTED Final   Neisseria meningitidis NOT DETECTED NOT DETECTED Final   Pseudomonas aeruginosa NOT DETECTED NOT DETECTED Final   Candida albicans NOT DETECTED NOT DETECTED Final   Candida glabrata NOT DETECTED NOT DETECTED Final   Candida krusei NOT DETECTED NOT DETECTED Final   Candida  parapsilosis NOT DETECTED NOT DETECTED Final   Candida tropicalis NOT DETECTED NOT DETECTED Final    Comment: Performed at [facility], [place] [address], [place], [place] [address]  Culture, blood (routine x 2)     Status: Abnormal   Collection Time: 05/02/19  7:05 PM   Specimen: BLOOD  Result Value Ref Range Status   Specimen Description   Final    BLOOD RIGHT ANTECUBITAL Performed at [place] [address], [place], [place] [address]    Special Requests   Final    BOTTLES DRAWN AEROBIC AND ANAEROBIC Blood Culture adequate volume Performed at [place] [address], [place], [place] [address]    Culture  Setup Time   Final    GRAM NEGATIVE RODS ANAEROBIC BOTTLE ONLY CRITICAL VALUE NOTED.  VALUE IS CONSISTENT WITH PREVIOUSLY REPORTED AND CALLED VALUE.    Culture (A)  Final    PROTEUS MIRABILIS SUSCEPTIBILITIES PERFORMED ON PREVIOUS CULTURE WITHIN THE LAST 5 DAYS. Performed at [facility], [place] [address], [place],  [address]    Report Status 05/05/2019 FINAL  Final  C difficile quick scan w PCR reflex     Status: Abnormal   Collection Time: 05/02/19 11:42 PM   Specimen: STOOL  Result Value Ref Range Status   C Diff antigen POSITIVE (A) NEGATIVE Final   C Diff toxin NEGATIVE NEGATIVE Final   C  Diff interpretation Results are indeterminate. See PCR results.  Final    Comment: Performed at [facility], [place] [address], [place], [place] [address]  Stool culture (children & immunocomp patients)     Status: None (Preliminary result)   Collection Time: 05/02/19 11:42 PM   Specimen: Stool  Result Value Ref Range Status   Salmonella/Shigella Screen Preliminary report  Final   Campylobacter Culture PENDING  Incomplete   E coli, Shiga toxin Assay Negative Negative Final    Comment: (NOTE) Performed At: [facility] [address] [place], [place] [id number] Rush Farmer MD QD:[id number]    C. Diff by PCR, Reflexed     Status: Abnormal   Collection Time: 05/02/19 11:42 PM  Result Value Ref Range Status   Toxigenic C. Difficile by PCR POSITIVE (A) NEGATIVE Final    Comment: Positive for toxigenic C. difficile with little to no toxin production. Only treat if clinical presentation suggests symptomatic illness. Performed at [facility], [place] [address] [place], [place] [address]   STOOL CULTURE REFLEX - RSASHR     Status: None   Collection Time: 05/02/19 11:42 PM  Result Value Ref Range Status   Stool Culture result 1 (RSASHR) Comment  Final    Comment: (NOTE) Microbiological testing to rule out the presence of possible pathogens is in progress. Performed At: [facility] [place], [place] [id number] Rush Farmer MD [id number]        Radiology Studies: No results found.    Scheduled Meds: . dicyclomine  10 mg Oral TID AC & HS  . enoxaparin (LOVENOX) injection  40 mg Subcutaneous Q24H  . escitalopram  10 mg Oral Daily  . hydrocortisone cream   Topical QID  . levothyroxine  50 mcg Oral QAC breakfast  . mesalamine  2.4 g Oral BID WC  . potassium chloride  40 mEq Oral Q4H  . vancomycin  125 mg Oral QID   Continuous Infusions: . cefTRIAXone (ROCEPHIN)  IV 2 g (05/06/19 1321)  . famotidine (PEPCID) IV 20 mg (05/07/19 0939)     LOS: 5 days    Time spent: 25 minutes   Dessa Phi, DO [facility] [email or web address] 05/07/2019, 10:54 AM

## 2019-05-07 NOTE — Telephone Encounter (Signed)
Discussed with pt that her doc in hospital can call GI consult/input to see if they think additional tests need to be done GI wise.

## 2019-05-08 DIAGNOSIS — A0472 Enterocolitis due to Clostridium difficile, not specified as recurrent: Secondary | ICD-10-CM

## 2019-05-08 DIAGNOSIS — A419 Sepsis, unspecified organism: Secondary | ICD-10-CM

## 2019-05-08 DIAGNOSIS — K51918 Ulcerative colitis, unspecified with other complication: Secondary | ICD-10-CM

## 2019-05-08 DIAGNOSIS — R7881 Bacteremia: Secondary | ICD-10-CM

## 2019-05-08 DIAGNOSIS — N390 Urinary tract infection, site not specified: Secondary | ICD-10-CM

## 2019-05-08 DIAGNOSIS — E876 Hypokalemia: Secondary | ICD-10-CM

## 2019-05-08 DIAGNOSIS — N201 Calculus of ureter: Secondary | ICD-10-CM

## 2019-05-08 DIAGNOSIS — N12 Tubulo-interstitial nephritis, not specified as acute or chronic: Secondary | ICD-10-CM

## 2019-05-08 LAB — CBC
HCT: 37.6 % (ref 36.0–46.0)
Hemoglobin: 11.8 g/dL — ABNORMAL LOW (ref 12.0–15.0)
MCH: 28.6 pg (ref 26.0–34.0)
MCHC: 31.4 g/dL (ref 30.0–36.0)
MCV: 91.3 fL (ref 80.0–100.0)
Platelets: 324 10*3/uL (ref 150–400)
RBC: 4.12 MIL/uL (ref 3.87–5.11)
RDW: 14.8 % (ref 11.5–15.5)
WBC: 9.1 10*3/uL (ref 4.0–10.5)
nRBC: 0 % (ref 0.0–0.2)

## 2019-05-08 LAB — BASIC METABOLIC PANEL
Anion gap: 10 (ref 5–15)
BUN: 6 mg/dL (ref 6–20)
CO2: 25 mmol/L (ref 22–32)
Calcium: 8.2 mg/dL — ABNORMAL LOW (ref 8.9–10.3)
Chloride: 106 mmol/L (ref 98–111)
Creatinine, Ser: 0.78 mg/dL (ref 0.44–1.00)
GFR calc Af Amer: >60 mL/min (ref >60)
GFR calc non Af Amer: >60 mL/min (ref >60)
Glucose, Bld: 103 mg/dL — ABNORMAL HIGH (ref 70–99)
Potassium: 3.3 mmol/L — ABNORMAL LOW (ref 3.5–5.1)
Sodium: 141 mmol/L (ref 135–145)

## 2019-05-08 MED ORDER — CEPHALEXIN 500 MG PO CAPS: 500.0000 mg | ORAL_CAPSULE | Freq: Three times a day (TID) | ORAL | 0 refills | Status: DC

## 2019-05-08 MED ORDER — CEPHALEXIN 500 MG PO CAPS: 500.0000 mg | ORAL_CAPSULE | Freq: Three times a day (TID) | ORAL | 0 refills | Status: AC

## 2019-05-08 MED ORDER — VANCOMYCIN HCL 125 MG PO CAPS: 125.0000 mg | ORAL_CAPSULE | Freq: Four times a day (QID) | ORAL | 0 refills | Status: AC

## 2019-05-08 MED ORDER — POTASSIUM CHLORIDE CRYS ER 20 MEQ PO TBCR
40.0000 meq | EXTENDED_RELEASE_TABLET | Freq: Once | ORAL | Status: AC
  Administered 2019-05-08: 40 meq via ORAL
  Filled 2019-05-08: qty 2

## 2019-05-08 MED ORDER — POTASSIUM CHLORIDE CRYS ER 20 MEQ PO TBCR: 20.0000 meq | EXTENDED_RELEASE_TABLET | ORAL | 0 refills | Status: AC

## 2019-05-08 MED ORDER — VANCOMYCIN HCL 125 MG PO CAPS: 125.0000 mg | ORAL_CAPSULE | Freq: Four times a day (QID) | ORAL | 0 refills | Status: DC

## 2019-05-08 MED ORDER — POTASSIUM CHLORIDE CRYS ER 20 MEQ PO TBCR: 20.0000 meq | EXTENDED_RELEASE_TABLET | ORAL | 0 refills | Status: DC

## 2019-05-08 NOTE — Progress Notes (Signed)
Discharge instructions given to pt and all questions were answered.  

## 2019-05-08 NOTE — Discharge Summary (Signed)
Discharge Summary  Joyce Kim IWL:[id number] DOB: 1960-11-24  PCP: Prince Solian, MD  Admit date: 05/02/2019 Discharge date: 05/08/2019  Time spent: 27mns, more than 50% time spent on coordination of care.  Recommendations for Outpatient Follow-up:  1. F/u with PCP within a week  for hospital discharge follow up, repeat cbc/bmp at follow up 2. F/u with urology Dr OKarsten Roin 1-2 weeks 3. F/u with gi Dr GCarlean Purl  Discharge Diagnoses:  Active Hospital Problems   Diagnosis Date Noted   Pyelonephritis 05/02/2019    Resolved Hospital Problems  No resolved problems to display.    Discharge Condition: stable  Diet recommendation: regular diet  Filed Weights   05/02/19 1727  Weight: 122.9 kg    History of present illness: ( per admitting MD Dr HAra Kussmaul  Primary Care Physician: APrince Solian MD  Chief Complaint:     Chief Complaint  Patient presents with   Fever   Diarrhea   Shortness of Breath   Emesis   Generalized Body Aches    HPI: Joyce Masriis a 58y.o. female with a known history of ulcerative colitis, GERD, hypothyroidism presents to the emergency department for evaluation of multiple symptoms including fevers, chills, nausea, vomiting and diarrhea.  Patient was in a usual state of health until that 1 month ago when she reported worsening of her usual diarrhea which has recently developed into a crampy type of abdominal pain which is diffuse.  For the past 2-week she is experienced fever associated with nausea and vomiting.  Of note, 2 months ago, patient took one month of prednisone for a presumed UC flare.     Otherwise there has been no change in status. Patient has been taking medication as prescribed and there has been no recent change in medication or diet.  No recent antibiotics.  There has been no recent illness, hospitalizations, travel or sick contacts.    EMS/ED Course: Patient received Rocephin, potassium,  Zofran and lactated Ringer's. Medical admission has been requested for further management of pyelonephritis.   Hospital Course:  Active Problems:   Pyelonephritis  Sepsis secondary to acute pyelonephritis/ right UPJ stone/ proteus bacteremia, presents on admission -Right ureteral stone with mild hydroureteronephrosis status post right ureteral stent placement -Follow-up with Dr. OKarsten Ro urology as an outpatient  -Foley catheter removed -she is treated with rocephin then cefazolin in the hospital, she is discharged on keflex to finish abx treatment course  Acute on chronic diarrhea, cdiff colitis? H/o ulcerative colitis She reports has been having diarrhea since early may -she is GMagaliaantigen test positive, toxigenic pcr + but toxic negative -she presented with fever /ab pain, she is started on oral vanc since she needs abx treatment. At time of discharge she still have watery diarrhea, reports stool is brown ,denies hematochezia, denies ab pain, no fever, no leukocytosis, plan to treat with oral vanc while she is on abx and 7days after abx treatment   Depression -Continue Lexapro  Morbid obesity -Status post lap band -Body mass index is 43.74 kg/m.  Hypokalemia -Replace, trend  Rash -Doubt antibiotic related as patient has been on antibiotic treatments for 4 days.  Only new medication prior to rash was potassium supplementation.  Improved with IV Benadryl, Pepcid and topical steroids -f/u with pcp    Procedures: Urology Procedure on 6/19 by Dr MAlyson Ingles1 cystoscopy 2. right retrograde pyelography 3.  Intraoperative fluoroscopy, under one hour, with interpretation  4. right 6 x 26 JJ stent placement  Consultations:  urology  Discharge Exam: BP (!) 147/86 (BP Location: Left Wrist)    Pulse (!) 57    Temp 97.7 F (36.5 C) (Oral)    Resp 18    Ht 5' 6"  (1.676 m)    Wt 122.9 kg    SpO2 97%    BMI 43.74 kg/m   General: NAD Cardiovascular: RRR Respiratory:  CTABL  Discharge Instructions You were cared for by a hospitalist during your hospital stay. If you have any questions about your discharge medications or the care you received while you were in the hospital after you are discharged, you can call the unit and asked to speak with the hospitalist on call if the hospitalist that took care of you is not available. Once you are discharged, your primary care physician will handle any further medical issues. Please note that NO REFILLS for any discharge medications will be authorized once you are discharged, as it is imperative that you return to your primary care physician (or establish a relationship with a primary care physician if you do not have one) for your aftercare needs so that they can reassess your need for medications and monitor your lab values.  Discharge Instructions    Diet general   Complete by: As directed    Increase activity slowly   Complete by: As directed      Allergies as of 05/08/2019      Reactions   Sulfonamide Derivatives Shortness Of Breath, Rash   Contrast Media [iodinated Diagnostic Agents] Hives   Patient takes Benadryl when getting the diagnostic agents, seems to be fine after.   Doxycycline Nausea And Vomiting      Medication List    STOP taking these medications   omeprazole 40 MG capsule Commonly known as: PRILOSEC   predniSONE 50 MG tablet Commonly known as: DELTASONE     TAKE these medications   aspirin 81 MG tablet Take 81 mg by mouth daily.   cephALEXin 500 MG capsule Commonly known as: KEFLEX Take 1 capsule (500 mg total) by mouth 3 (three) times daily for 4 days.   cetirizine 10 MG tablet Commonly known as: ZYRTEC Take 10 mg by mouth daily.   escitalopram 10 MG tablet Commonly known as: LEXAPRO Take 10 mg by mouth daily.   levothyroxine 50 MCG tablet Commonly known as: SYNTHROID Take 50 mcg by mouth daily before breakfast.   loperamide 2 MG tablet Commonly known as: IMODIUM  A-D Take 1 mg by mouth 2 (two) times daily as needed for diarrhea or loose stools.   mesalamine 1.2 g EC tablet Commonly known as: LIALDA TAKE 2 TABLETS BY MOUTH TWICE DAILY WITH A MEAL What changed: See the new instructions.   multivitamin capsule Take 1 capsule by mouth daily.   potassium chloride SA 20 MEQ tablet Commonly known as: K-DUR Take 1 tablet (20 mEq total) by mouth every Monday, Wednesday, and Friday for 14 doses.   vancomycin 125 MG capsule Commonly known as: Vancocin HCl Take 1 capsule (125 mg total) by mouth 4 (four) times daily for 14 days.      Allergies  Allergen Reactions   Sulfonamide Derivatives Shortness Of Breath and Rash   Contrast Media [Iodinated Diagnostic Agents] Hives    Patient takes Benadryl when getting the diagnostic agents, seems to be fine after.   Doxycycline Nausea And Vomiting   Follow-up Information    Call Kathie Rhodes, MD.   Specialty: Urology Why: For an appointment in 1-2 weeks  when you get home. Contact information: [place] [address] [phone]        Prince Solian, MD Follow up in 1 week(s).   Specialty: Internal Medicine Why: hospital discharge follow up, repeat cbc/bmp at follow up Contact information: [place] [address] [phone]        Gatha Mayer, MD Follow up in 3 week(s).   Specialty: Gastroenterology Contact information: [address] [address] [phone]            The results of significant diagnostics from this hospitalization (including imaging, microbiology, ancillary and laboratory) are listed below for reference.    Significant Diagnostic Studies: Dg Abdomen 1 View  Result Date: 05/02/2019 CLINICAL DATA:  Diarrhea with abdominal bloating. EXAM: ABDOMEN - 1 VIEW COMPARISON:  CT scan 03/28/2019 FINDINGS: Supine view the abdomen shows no gaseous bowel dilatation to suggest obstruction. Lap band has a vertical orientation,  stable since CT scan and slightly more vertical than on 8 KUB from 11/03/2016. Surgical clips right upper quadrant suggest prior cholecystectomy. Visualized bony anatomy unremarkable. IMPRESSION: No evidence for bowel obstruction. Vertical orientation of the lap band is similar to the recent CT scout image but appears to have become more vertical comparing to the KUB from 11/03/2016. Electronically Signed   By: Misty Stanley M.D.   On: 05/02/2019 20:50   Dg Chest Portable 1 View  Result Date: 05/02/2019 CLINICAL DATA:  Shortness of breath EXAM: PORTABLE CHEST 1 VIEW COMPARISON:  08/03/2007 FINDINGS: Mild cardiomegaly. No focal opacity or pleural effusion. No pneumothorax. IMPRESSION: No active disease.  Cardiomegaly Electronically Signed   By: Donavan Foil M.D.   On: 05/02/2019 19:18   Dg Fluoro Rm 1-60 Min - No Report  Result Date: 05/03/2019 Fluoroscopy was utilized by the requesting physician.  No radiographic interpretation.   Ct Renal Stone Study  Result Date: 05/03/2019 CLINICAL DATA:  Flank pain, abdominal pain, nausea, vomiting, acute on chronic diarrhea, pyelonephritis, ulcerative colitis EXAM: CT ABDOMEN AND PELVIS WITHOUT CONTRAST TECHNIQUE: Multidetector CT imaging of the abdomen and pelvis was performed following the standard protocol without IV contrast. COMPARISON:  03/28/2019 FINDINGS: Lower chest: Bibasilar scarring or atelectasis. Small hiatal hernia. Hepatobiliary: No focal liver abnormality is seen. Status post cholecystectomy. No biliary dilatation. Pancreas: Unremarkable. No pancreatic ductal dilatation or surrounding inflammatory changes. Spleen: Normal in size without significant abnormality. Adrenals/Urinary Tract: Adrenal glands are unremarkable. There is a 3 mm calculus of the proximal right ureter with mild right hydronephrosis and adjacent perinephric fat stranding. There is an additional punctuate nonobstructive calculus of the inferior pole of left kidney. Bladder is  unremarkable. Stomach/Bowel: Adjustable gastric lap band and reservoir. Appendix appears normal. The colon is generally featureless and thickened appearing, particularly in the ascending and transverse colon. Vascular/Lymphatic: No significant vascular findings are present. No enlarged abdominal or pelvic lymph nodes. Reproductive: No mass or other significant abnormality. Other: No abdominal wall hernia or abnormality. No abdominopelvic ascites. Musculoskeletal: No acute or significant osseous findings. IMPRESSION: 1. There is a 3 mm calculus of the proximal right ureter with mild right hydronephrosis and adjacent perinephric fat stranding. This is calculus is in unchanged position compared to prior CT dated 03/28/2019, however perinephric fat stranding is new, concerning for infection. There is an additional punctuate nonobstructive calculus of the inferior pole of left kidney. 2. The colon is generally featureless and thickened appearing, particularly in the ascending and transverse colon, in keeping with reported history of ulcerative  colitis. 3. Other chronic, incidental, and postoperative findings as detailed above. Electronically Signed   By: Eddie Candle M.D.   On: 05/03/2019 16:48    Microbiology: Recent Results (from the past 240 hour(s))  Urine culture     Status: Abnormal   Collection Time: 05/02/19  6:14 PM   Specimen: Urine, Clean Catch  Result Value Ref Range Status   Specimen Description   Final    URINE, CLEAN CATCH Performed at [facility], [place] [address], [place], [place] [address]    Special Requests   Final    NONE Performed at [facility] [place] [address] [place], [place] [address]    Culture >=100,000 COLONIES/mL PROTEUS MIRABILIS (A)  Final   Report Status 05/05/2019 FINAL  Final   Organism ID, Bacteria PROTEUS MIRABILIS (A)  Final      Susceptibility   Proteus mirabilis - MIC*    AMPICILLIN <=2 SENSITIVE Sensitive     CEFAZOLIN  <=4 SENSITIVE Sensitive     CEFTRIAXONE <=1 SENSITIVE Sensitive     CIPROFLOXACIN <=0.25 SENSITIVE Sensitive     GENTAMICIN 2 SENSITIVE Sensitive     IMIPENEM 8 INTERMEDIATE Intermediate     NITROFURANTOIN 256 RESISTANT Resistant     TRIMETH/SULFA <=20 SENSITIVE Sensitive     AMPICILLIN/SULBACTAM <=2 SENSITIVE Sensitive     PIP/TAZO <=4 SENSITIVE Sensitive     * >=100,000 COLONIES/mL PROTEUS MIRABILIS  SARS Coronavirus 2 (CEPHEID- Performed in [employer] hospital lab), Hosp Order     Status: None   Collection Time: 05/02/19  6:36 PM   Specimen: Nasopharyngeal Swab  Result Value Ref Range Status   SARS Coronavirus 2 NEGATIVE NEGATIVE Final    Comment: (NOTE) If result is NEGATIVE SARS-CoV-2 target nucleic acids are NOT DETECTED. The SARS-CoV-2 RNA is generally detectable in upper and lower  respiratory specimens during the acute phase of infection. The lowest  concentration of SARS-CoV-2 viral copies this assay can detect is 250  copies / mL. A negative result does not preclude SARS-CoV-2 infection  and should not be used as the sole basis for treatment or other  patient management decisions.  A negative result may occur with  improper specimen collection / handling, submission of specimen other  than nasopharyngeal swab, presence of viral mutation(s) within the  areas targeted by this assay, and inadequate number of viral copies  (<250 copies / mL). A negative result must be combined with clinical  observations, patient history, and epidemiological information. If result is POSITIVE SARS-CoV-2 target nucleic acids are DETECTED. The SARS-CoV-2 RNA is generally detectable in upper and lower  respiratory specimens dur ing the acute phase of infection.  Positive  results are indicative of active infection with SARS-CoV-2.  Clinical  correlation with patient history and other diagnostic information is  necessary to determine patient infection status.  Positive results do  not rule  out bacterial infection or co-infection with other viruses. If result is PRESUMPTIVE POSTIVE SARS-CoV-2 nucleic acids MAY BE PRESENT.   A presumptive positive result was obtained on the submitted specimen  and confirmed on repeat testing.  While 2019 novel coronavirus  (SARS-CoV-2) nucleic acids may be present in the submitted sample  additional confirmatory testing may be necessary for epidemiological  and / or clinical management purposes  to differentiate between  SARS-CoV-2 and other Sarbecovirus currently known to infect humans.  If clinically indicated additional testing with an alternate test  methodology [phone]) is advised. The SARS-CoV-2 RNA is  generally  detectable in upper and lower respiratory sp ecimens during the acute  phase of infection. The expected result is Negative. Fact Sheet for Patients:  StrictlyIdeas.no Fact Sheet for Healthcare Providers: BankingDealers.co.za This test is not yet approved or cleared by the [place] FDA and has been authorized for detection and/or diagnosis of SARS-CoV-2 by FDA under an Emergency Use Authorization (EUA).  This EUA will remain in effect (meaning this test can be used) for the duration of the COVID-19 declaration under Section 564(b)(1) of the Act, 21 U.S.C. section 360bbb-3(b)(1), unless the authorization is terminated or revoked sooner. Performed at [facility], [place] [address] [place], [place] [address]   Gastrointestinal Panel by PCR , Stool     Status: None   Collection Time: 05/02/19  6:37 PM   Specimen: Stool  Result Value Ref Range Status   Campylobacter species NOT DETECTED NOT DETECTED Final   Plesimonas shigelloides NOT DETECTED NOT DETECTED Final   Salmonella species NOT DETECTED NOT DETECTED Final   Yersinia enterocolitica NOT DETECTED NOT DETECTED Final   Vibrio species NOT DETECTED NOT DETECTED Final   Vibrio cholerae NOT DETECTED NOT  DETECTED Final   Enteroaggregative E coli (EAEC) NOT DETECTED NOT DETECTED Final   Enteropathogenic E coli (EPEC) NOT DETECTED NOT DETECTED Final   Enterotoxigenic E coli (ETEC) NOT DETECTED NOT DETECTED Final   Shiga like toxin producing E coli (STEC) NOT DETECTED NOT DETECTED Final   Shigella/Enteroinvasive E coli (EIEC) NOT DETECTED NOT DETECTED Final   Cryptosporidium NOT DETECTED NOT DETECTED Final   Cyclospora cayetanensis NOT DETECTED NOT DETECTED Final   Entamoeba histolytica NOT DETECTED NOT DETECTED Final   Giardia lamblia NOT DETECTED NOT DETECTED Final   Adenovirus F40/41 NOT DETECTED NOT DETECTED Final   Astrovirus NOT DETECTED NOT DETECTED Final   Norovirus GI/GII NOT DETECTED NOT DETECTED Final   Rotavirus A NOT DETECTED NOT DETECTED Final   Sapovirus (I, II, IV, and V) NOT DETECTED NOT DETECTED Final    Comment: Performed at [facility], [place] [place], [place] [address]  Culture, blood (routine x 2)     Status: Abnormal   Collection Time: 05/02/19  6:58 PM   Specimen: BLOOD  Result Value Ref Range Status   Specimen Description   Final    BLOOD LEFT ANTECUBITAL Performed at [facility], [place] [address] [place], [place] [address]    Special Requests   Final    BOTTLES DRAWN AEROBIC AND ANAEROBIC Blood Culture adequate volume Performed at [place] [address], [place], [place] [address]    Culture  Setup Time   Final    GRAM NEGATIVE RODS IN BOTH AEROBIC AND ANAEROBIC BOTTLES CRITICAL RESULT CALLED TO, READ BACK BY AND VERIFIED WITH: Chales Abrahams PharmD 13:15 05/03/19 ([email or web address]) Performed at [facility], [place] [address], [place], [place] [address]    Culture PROTEUS MIRABILIS (A)  Final   Report Status 05/05/2019 FINAL  Final   Organism ID, Bacteria PROTEUS MIRABILIS  Final      Susceptibility   Proteus mirabilis - MIC*    AMPICILLIN <=2 SENSITIVE Sensitive     CEFAZOLIN <=4 SENSITIVE Sensitive      CEFEPIME <=1 SENSITIVE Sensitive     CEFTAZIDIME <=1 SENSITIVE Sensitive     CEFTRIAXONE <=1 SENSITIVE Sensitive     CIPROFLOXACIN <=0.25 SENSITIVE Sensitive     GENTAMICIN 2 SENSITIVE Sensitive     IMIPENEM 4  SENSITIVE Sensitive     TRIMETH/SULFA <=20 SENSITIVE Sensitive     AMPICILLIN/SULBACTAM <=2 SENSITIVE Sensitive     PIP/TAZO <=4 SENSITIVE Sensitive     * PROTEUS MIRABILIS  Blood Culture ID Panel (Reflexed)     Status: Abnormal   Collection Time: 05/02/19  6:58 PM  Result Value Ref Range Status   Enterococcus species NOT DETECTED NOT DETECTED Final   Listeria monocytogenes NOT DETECTED NOT DETECTED Final   Staphylococcus species NOT DETECTED NOT DETECTED Final   Staphylococcus aureus (BCID) NOT DETECTED NOT DETECTED Final   Streptococcus species NOT DETECTED NOT DETECTED Final   Streptococcus agalactiae NOT DETECTED NOT DETECTED Final   Streptococcus pneumoniae NOT DETECTED NOT DETECTED Final   Streptococcus pyogenes NOT DETECTED NOT DETECTED Final   Acinetobacter baumannii NOT DETECTED NOT DETECTED Final   Enterobacteriaceae species DETECTED (A) NOT DETECTED Final    Comment: Enterobacteriaceae represent a large family of gram-negative bacteria, not a single organism. CRITICAL RESULT CALLED TO, READ BACK BY AND VERIFIED WITH: Chales Abrahams PharmD 13:15 05/03/19 ([email or web address])    Enterobacter cloacae complex NOT DETECTED NOT DETECTED Final   Escherichia coli NOT DETECTED NOT DETECTED Final   Klebsiella oxytoca NOT DETECTED NOT DETECTED Final   Klebsiella pneumoniae NOT DETECTED NOT DETECTED Final   Proteus species DETECTED (A) NOT DETECTED Final    Comment: CRITICAL RESULT CALLED TO, READ BACK BY AND VERIFIED WITH: Chales Abrahams PharmD 13:15 05/03/19 ([email or web address])    Serratia marcescens NOT DETECTED NOT DETECTED Final   Carbapenem resistance NOT DETECTED NOT DETECTED Final   Haemophilus influenzae NOT DETECTED NOT DETECTED Final   Neisseria meningitidis NOT DETECTED NOT DETECTED Final    Pseudomonas aeruginosa NOT DETECTED NOT DETECTED Final   Candida albicans NOT DETECTED NOT DETECTED Final   Candida glabrata NOT DETECTED NOT DETECTED Final   Candida krusei NOT DETECTED NOT DETECTED Final   Candida parapsilosis NOT DETECTED NOT DETECTED Final   Candida tropicalis NOT DETECTED NOT DETECTED Final    Comment: Performed at [facility], [place] [address], [place], [place] [address]  Culture, blood (routine x 2)     Status: Abnormal   Collection Time: 05/02/19  7:05 PM   Specimen: BLOOD  Result Value Ref Range Status   Specimen Description   Final    BLOOD RIGHT ANTECUBITAL Performed at [place] [address], [place], [place] [address]    Special Requests   Final    BOTTLES DRAWN AEROBIC AND ANAEROBIC Blood Culture adequate volume Performed at [place] [address] [place], [place] [address]    Culture  Setup Time   Final    GRAM NEGATIVE RODS ANAEROBIC BOTTLE ONLY CRITICAL VALUE NOTED.  VALUE IS CONSISTENT WITH PREVIOUSLY REPORTED AND CALLED VALUE.    Culture (A)  Final    PROTEUS MIRABILIS SUSCEPTIBILITIES PERFORMED ON PREVIOUS CULTURE WITHIN THE LAST 5 DAYS. Performed at [facility], [place] [address], [place], [place] [address]    Report Status 05/05/2019 FINAL  Final  C difficile quick scan w PCR reflex     Status: Abnormal   Collection Time: 05/02/19 11:42 PM   Specimen: STOOL  Result Value Ref Range Status   C Diff antigen POSITIVE (A) NEGATIVE Final   C Diff toxin NEGATIVE NEGATIVE Final   C Diff interpretation Results are indeterminate. See PCR results.  Final    Comment: Performed at [facility], [place] [place], [place]  [address]  Stool culture (children & immunocomp patients)     Status: None   Collection Time: 05/02/19 11:42 PM   Specimen: Stool  Result Value Ref Range Status   Salmonella/Shigella Screen Final report  Corrected    Comment: CORRECTED ON 06/23 AT  1535: PREVIOUSLY REPORTED AS Preliminary report   Campylobacter Culture Final report  Final   E coli, Shiga toxin Assay Negative Negative Final    Comment: (NOTE) Performed At: [facility] [place], [place] [id number] Rush Farmer MD QA:[id number]   C. Diff by PCR, Reflexed     Status: Abnormal   Collection Time: 05/02/19 11:42 PM  Result Value Ref Range Status   Toxigenic C. Difficile by PCR POSITIVE (A) NEGATIVE Final    Comment: Positive for toxigenic C. difficile with little to no toxin production. Only treat if clinical presentation suggests symptomatic illness. Performed at [facility], [place] [address], [place], [place] [address]   STOOL CULTURE REFLEX - RSASHR     Status: None   Collection Time: 05/02/19 11:42 PM  Result Value Ref Range Status   Stool Culture result 1 (RSASHR) Comment  Final    Comment: (NOTE) No Salmonella or Shigella recovered. Performed At: [facility] [address] [place], [place] [id number] Rush Farmer MD [id number]   STOOL CULTURE Reflex - CMPCXR     Status: None   Collection Time: 05/02/19 11:42 PM  Result Value Ref Range Status   Stool Culture result 1 (CMPCXR) Comment  Final    Comment: (NOTE) No Campylobacter species isolated. Performed At: [facility] [place], [place] [id number] Rush Farmer MD IF:[id number]      Labs: Basic Metabolic Panel: Recent Labs  Lab 05/02/19 1919 05/03/19 0029 05/03/19 0336 05/04/19 0443 05/06/19 0340 05/07/19 0447 05/08/19 0333  NA 141  --  139 139 142 140 141  K 3.0*  --  3.1* 3.8 3.0* 3.2* 3.3*  CL 104  --  107 105 105 104 106  CO2 25  --  21* 23 29 29 25   GLUCOSE 134*  --  150* 169* 106* 100* 103*  BUN 6  --  9 14 9  5* 6  CREATININE 0.91  --  0.89 0.86 0.90 0.80 0.78  CALCIUM 8.6*  --  8.0* 8.3* 8.4* 8.4* 8.2*  MG 1.8 1.9  --  2.0  --   --   --   PHOS  --  3.3  --   --   --   --   --    Liver Function Tests: Recent Labs   Lab 05/02/19 1919 05/04/19 0443  AST 21 28  ALT 19 23  ALKPHOS 88 71  BILITOT 0.9 0.8  PROT 6.9 6.1*  ALBUMIN 3.5 2.9*   No results for input(s): LIPASE, AMYLASE in the last 168 hours. No results for input(s): AMMONIA in the last 168 hours. CBC: Recent Labs  Lab 05/02/19 1919 05/03/19 0336 05/04/19 0443 05/06/19 0340 05/07/19 0447 05/08/19 0333  WBC 11.7* 15.0* 9.7 9.7 8.3 9.1  NEUTROABS 9.4*  --  8.3*  --   --   --   HGB 13.3 12.5 11.8* 12.0 11.7* 11.8*  HCT 41.5 38.8 38.4 39.0 37.9 37.6  MCV 89.6 90.2 91.9 90.1 89.6 91.3  PLT 312 270 242 309 315 324   Cardiac Enzymes: No results for input(s): CKTOTAL, CKMB, CKMBINDEX, TROPONINI in the last 168 hours. BNP: BNP (last 3 results) No results for input(s): BNP  in the last 8760 hours.  ProBNP (last 3 results) No results for input(s): PROBNP in the last 8760 hours.  CBG: Recent Labs  Lab 05/03/19 1018  GLUCAP 126*       Signed:  Florencia Reasons MD, PhD  [facility] 05/08/2019, 3:49 PM

## 2019-05-10 ENCOUNTER — Other Ambulatory Visit: Payer: Self-pay | Admitting: Urology

## 2019-05-16 ENCOUNTER — Other Ambulatory Visit (HOSPITAL_COMMUNITY)
Admission: RE | Admit: 2019-05-16 | Discharge: 2019-05-16 | Disposition: A | Discharge status: Home / Self Care | Payer: BC Managed Care – PPO | Source: Ambulatory Visit | Attending: Urology | Admitting: Urology

## 2019-05-16 ENCOUNTER — Encounter (HOSPITAL_BASED_OUTPATIENT_CLINIC_OR_DEPARTMENT_OTHER): Payer: Self-pay | Admitting: *Deleted

## 2019-05-16 ENCOUNTER — Other Ambulatory Visit: Payer: Self-pay

## 2019-05-16 DIAGNOSIS — Z01812 Encounter for preprocedural laboratory examination: Secondary | ICD-10-CM | POA: Diagnosis not present

## 2019-05-16 DIAGNOSIS — Z1159 Encounter for screening for other viral diseases: Secondary | ICD-10-CM | POA: Diagnosis not present

## 2019-05-16 LAB — SARS CORONAVIRUS 2 (TAT 6-24 HRS): SARS Coronavirus 2: NEGATIVE

## 2019-05-16 NOTE — Progress Notes (Signed)
Spoke with Pearle Npo after midnight food, clear liquids until 630 am then npo  meds to take: lexapro, zyrtec, levothyroxine, vancomycin, pepcid Patient reports bowel movement now done to 2 soft stools per day(taking vancomycin for + c diif by pcr, had 2 negative stool cultures, vancomycibne given as precaution) Records on chart?epic: 05-02-2019 ekg, cbc and bmet 05-08-2019 Has surgeyr orders in peic  spouse driver richard [phone] cell

## 2019-05-20 ENCOUNTER — Ambulatory Visit (HOSPITAL_BASED_OUTPATIENT_CLINIC_OR_DEPARTMENT_OTHER): Payer: BC Managed Care – PPO | Admitting: Anesthesiology

## 2019-05-20 ENCOUNTER — Ambulatory Visit (HOSPITAL_COMMUNITY): Payer: BC Managed Care – PPO

## 2019-05-20 ENCOUNTER — Other Ambulatory Visit: Payer: Self-pay

## 2019-05-20 ENCOUNTER — Encounter (HOSPITAL_BASED_OUTPATIENT_CLINIC_OR_DEPARTMENT_OTHER): Payer: Self-pay | Admitting: *Deleted

## 2019-05-20 ENCOUNTER — Ambulatory Visit (HOSPITAL_BASED_OUTPATIENT_CLINIC_OR_DEPARTMENT_OTHER)
Admission: RE | Admit: 2019-05-20 | Discharge: 2019-05-20 | Disposition: A | Discharge status: Home / Self Care | Payer: BC Managed Care – PPO | Attending: Urology | Admitting: Urology

## 2019-05-20 ENCOUNTER — Encounter (HOSPITAL_BASED_OUTPATIENT_CLINIC_OR_DEPARTMENT_OTHER)
Admission: RE | Disposition: A | Discharge status: Home / Self Care | Payer: Self-pay | Source: Home / Self Care | Attending: Urology

## 2019-05-20 DIAGNOSIS — K219 Gastro-esophageal reflux disease without esophagitis: Secondary | ICD-10-CM | POA: Insufficient documentation

## 2019-05-20 DIAGNOSIS — N201 Calculus of ureter: Secondary | ICD-10-CM | POA: Diagnosis present

## 2019-05-20 DIAGNOSIS — Z6841 Body Mass Index (BMI) 40.0 and over, adult: Secondary | ICD-10-CM | POA: Diagnosis not present

## 2019-05-20 DIAGNOSIS — Z9884 Bariatric surgery status: Secondary | ICD-10-CM | POA: Diagnosis not present

## 2019-05-20 HISTORY — PX: URETEROSCOPY WITH HOLMIUM LASER LITHOTRIPSY: SHX6645

## 2019-05-20 SURGERY — URETEROSCOPY, WITH LITHOTRIPSY USING HOLMIUM LASER
Anesthesia: General | Site: Ureter | Laterality: Right

## 2019-05-20 MED ORDER — DIPHENHYDRAMINE HCL 50 MG/ML IJ SOLN
INTRAMUSCULAR | Status: DC | PRN
  Administered 2019-05-20: 25 mg via INTRAVENOUS

## 2019-05-20 MED ORDER — LIDOCAINE 2% (20 MG/ML) 5 ML SYRINGE
INTRAMUSCULAR | Status: AC
  Filled 2019-05-20: qty 5

## 2019-05-20 MED ORDER — FENTANYL CITRATE (PF) 100 MCG/2ML IJ SOLN
INTRAMUSCULAR | Status: AC
  Filled 2019-05-20: qty 2

## 2019-05-20 MED ORDER — HYDROCODONE-ACETAMINOPHEN 10-325 MG PO TABS
ORAL_TABLET | ORAL | Status: AC
  Filled 2019-05-20: qty 1

## 2019-05-20 MED ORDER — DEXAMETHASONE SODIUM PHOSPHATE 10 MG/ML IJ SOLN
INTRAMUSCULAR | Status: DC | PRN
  Administered 2019-05-20: 10 mg via INTRAVENOUS

## 2019-05-20 MED ORDER — LACTATED RINGERS IV SOLN
INTRAVENOUS | Status: DC
  Administered 2019-05-20: 12:00:00 via INTRAVENOUS
  Filled 2019-05-20: qty 1000

## 2019-05-20 MED ORDER — FENTANYL CITRATE (PF) 100 MCG/2ML IJ SOLN
INTRAMUSCULAR | Status: DC | PRN
  Administered 2019-05-20 (×2): 50 ug via INTRAVENOUS

## 2019-05-20 MED ORDER — MIDAZOLAM HCL 5 MG/5ML IJ SOLN
INTRAMUSCULAR | Status: DC | PRN
  Administered 2019-05-20: 2 mg via INTRAVENOUS

## 2019-05-20 MED ORDER — PROPOFOL 10 MG/ML IV BOLUS
INTRAVENOUS | Status: AC
  Filled 2019-05-20: qty 20

## 2019-05-20 MED ORDER — HYDROCODONE-ACETAMINOPHEN 10-325 MG PO TABS: 1.0000 | ORAL_TABLET | ORAL | 0 refills | Status: AC | PRN

## 2019-05-20 MED ORDER — PROPOFOL 10 MG/ML IV BOLUS
INTRAVENOUS | Status: DC | PRN
  Administered 2019-05-20: 160 mg via INTRAVENOUS
  Administered 2019-05-20: 40 mg via INTRAVENOUS

## 2019-05-20 MED ORDER — CIPROFLOXACIN IN D5W 400 MG/200ML IV SOLN
400.0000 mg | Freq: Once | INTRAVENOUS | Status: AC
  Administered 2019-05-20: 400 mg via INTRAVENOUS
  Filled 2019-05-20: qty 200

## 2019-05-20 MED ORDER — PHENAZOPYRIDINE HCL 200 MG PO TABS: 200.0000 mg | ORAL_TABLET | Freq: Three times a day (TID) | ORAL | 0 refills | Status: AC | PRN

## 2019-05-20 MED ORDER — CIPROFLOXACIN IN D5W 400 MG/200ML IV SOLN
INTRAVENOUS | Status: AC
  Filled 2019-05-20: qty 200

## 2019-05-20 MED ORDER — SUCCINYLCHOLINE CHLORIDE 20 MG/ML IJ SOLN
INTRAMUSCULAR | Status: DC | PRN
  Administered 2019-05-20: 120 mg via INTRAVENOUS

## 2019-05-20 MED ORDER — SODIUM CHLORIDE 0.9 % IR SOLN
Status: DC | PRN
  Administered 2019-05-20: 3000 mL

## 2019-05-20 MED ORDER — MIDAZOLAM HCL 2 MG/2ML IJ SOLN
INTRAMUSCULAR | Status: AC
  Filled 2019-05-20: qty 2

## 2019-05-20 MED ORDER — HYDROCODONE-ACETAMINOPHEN 10-325 MG PO TABS
1.0000 | ORAL_TABLET | Freq: Once | ORAL | Status: AC
  Administered 2019-05-20: 1 via ORAL
  Filled 2019-05-20: qty 1

## 2019-05-20 MED ORDER — LIDOCAINE HCL (CARDIAC) PF 100 MG/5ML IV SOSY
PREFILLED_SYRINGE | INTRAVENOUS | Status: DC | PRN
  Administered 2019-05-20: 40 mg via INTRAVENOUS

## 2019-05-20 MED ORDER — ONDANSETRON HCL 4 MG/2ML IJ SOLN
INTRAMUSCULAR | Status: DC | PRN
  Administered 2019-05-20: 4 mg via INTRAVENOUS

## 2019-05-20 MED ORDER — IOHEXOL 300 MG/ML  SOLN
INTRAMUSCULAR | Status: DC | PRN
  Administered 2019-05-20: 12:00:00 20 mL via URETHRAL

## 2019-05-20 SURGICAL SUPPLY — 28 items
BAG DRAIN URO-CYSTO SKYTR STRL (DRAIN) ×2 IMPLANT
BASKET ZERO TIP NITINOL 2.4FR (BASKET) IMPLANT
CATH INTERMIT  6FR 70CM (CATHETERS) ×2 IMPLANT
CATH URET 5FR 28IN CONE TIP (BALLOONS)
CATH URET 5FR 70CM CONE TIP (BALLOONS) IMPLANT
CLOTH BEACON ORANGE TIMEOUT ST (SAFETY) ×2 IMPLANT
EXTRACTOR STONE 1.7FRX115CM (UROLOGICAL SUPPLIES) ×2 IMPLANT
FIBER LASER FLEXIVA 365 (UROLOGICAL SUPPLIES) IMPLANT
FIBER LASER TRAC TIP (UROLOGICAL SUPPLIES) IMPLANT
GLOVE BIO SURGEON STRL SZ 6.5 (GLOVE) ×2 IMPLANT
GLOVE BIO SURGEON STRL SZ7 (GLOVE) ×2 IMPLANT
GLOVE BIO SURGEON STRL SZ7.5 (GLOVE) ×2 IMPLANT
GLOVE BIO SURGEON STRL SZ8 (GLOVE) ×2 IMPLANT
GLOVE BIOGEL PI IND STRL 7.0 (GLOVE) ×1 IMPLANT
GLOVE BIOGEL PI INDICATOR 7.0 (GLOVE) ×1
GOWN STRL REUS W/ TWL LRG LVL3 (GOWN DISPOSABLE) ×1 IMPLANT
GOWN STRL REUS W/TWL LRG LVL3 (GOWN DISPOSABLE) ×1
GOWN STRL REUS W/TWL XL LVL3 (GOWN DISPOSABLE) ×4 IMPLANT
GUIDEWIRE ANG ZIPWIRE 038X150 (WIRE) ×2 IMPLANT
GUIDEWIRE STR DUAL SENSOR (WIRE) ×2 IMPLANT
IV NS IRRIG 3000ML ARTHROMATIC (IV SOLUTION) ×2 IMPLANT
KIT TURNOVER CYSTO (KITS) ×2 IMPLANT
MANIFOLD NEPTUNE II (INSTRUMENTS) ×2 IMPLANT
NS IRRIG 500ML POUR BTL (IV SOLUTION) ×2 IMPLANT
PACK CYSTO (CUSTOM PROCEDURE TRAY) ×2 IMPLANT
TUBE CONNECTING 12X1/4 (SUCTIONS) ×2 IMPLANT
TUBING UROLOGY SET (TUBING) ×2 IMPLANT
WATER STERILE IRR 3000ML UROMA (IV SOLUTION) IMPLANT

## 2019-05-20 NOTE — Transfer of Care (Signed)
Immediate Anesthesia Transfer of Care Note  Patient: [place]  Procedure(s) Performed: STENT REMOVAL /URETEROSCOPY/ STONE EXTRACTION (Right Ureter)  Patient Location: PACU  Anesthesia Type:General  Level of Consciousness: awake, alert , oriented, drowsy and patient cooperative  Airway & Oxygen Therapy: Patient Spontanous Breathing and Patient connected to nasal cannula oxygen  Post-op Assessment: Report given to RN and Post -op Vital signs reviewed and stable  Post vital signs: Reviewed and stable  Last Vitals:  Vitals Value Taken Time  BP 159/79 05/20/19 1245  Temp 36.9 C 05/20/19 1245  Pulse 66 05/20/19 1247  Resp 13 05/20/19 1247  SpO2 90 % 05/20/19 1247  Vitals shown include unvalidated device data.  Last Pain:  Vitals:   05/20/19 1153  TempSrc:   PainSc: 0-No pain      Patients Stated Pain Goal: 2 (45/62/56 [id number])  Complications: No apparent anesthesia complications

## 2019-05-20 NOTE — Anesthesia Preprocedure Evaluation (Addendum)
Anesthesia Evaluation  Patient identified by MRN, date of birth, ID band Patient awake    Reviewed: Allergy & Precautions, NPO status , Patient's Chart, lab work & pertinent test results  Airway Mallampati: III  TM Distance: >3 FB     Dental  (+) Dental Advisory Given   Pulmonary neg pulmonary ROS,    breath sounds clear to auscultation       Cardiovascular + dysrhythmias  Rhythm:Regular Rate:Normal     Neuro/Psych negative neurological ROS     GI/Hepatic Neg liver ROS, PUD, GERD  Medicated and Poorly Controlled,UC S/p gastric banding    Endo/Other  Hypothyroidism Morbid obesity  Renal/GU negative Renal ROS     Musculoskeletal  (+) Arthritis ,   Abdominal   Peds  Hematology negative hematology ROS (+)   Anesthesia Other Findings   Reproductive/Obstetrics                            Lab Results  Component Value Date   WBC 9.1 05/08/2019   HGB 11.8 (L) 05/08/2019   HCT 37.6 05/08/2019   MCV 91.3 05/08/2019   PLT 324 05/08/2019   Lab Results  Component Value Date   CREATININE 0.78 05/08/2019   BUN 6 05/08/2019   NA 141 05/08/2019   K 3.3 (L) 05/08/2019   CL 106 05/08/2019   CO2 25 05/08/2019    Anesthesia Physical  Anesthesia Plan  ASA: III  Anesthesia Plan: General   Post-op Pain Management:    Induction: Intravenous  PONV Risk Score and Plan: 3 and Dexamethasone, Ondansetron and Treatment may vary due to age or medical condition  Airway Management Planned: Oral ETT  Additional Equipment:   Intra-op Plan:   Post-operative Plan:   Informed Consent: I have reviewed the patients History and Physical, chart, labs and discussed the procedure including the risks, benefits and alternatives for the proposed anesthesia with the patient or authorized representative who has indicated his/her understanding and acceptance.     Dental advisory given  Plan Discussed with:  CRNA  Anesthesia Plan Comments:        Anesthesia Quick Evaluation

## 2019-05-20 NOTE — Discharge Instructions (Signed)
Post stone removal/stent placement surgery instructions   Definitions:  Ureter: The duct that transports urine from the kidney to the bladder. Stent: A plastic hollow tube that is placed into the ureter, from the kidney to the bladder to prevent the ureter from swelling shut.  General instructions:  Despite the fact that no skin incisions were used, the area around the ureter and bladder is raw and irritated. The stent is a foreign body which will further irritate the bladder wall. This irritation is manifested by increased frequency of urination, both day and night, and by an increase in the urge to urinate. In some, the urge to urinate is present almost always. Sometimes the urge is strong enough that you may not be able to stop your self from urinating. The only real cure is to remove the stent and then give time for the bladder wall to heal which can't be done until the danger of the ureter swelling shut has passed. (This varies from 2-21 days).  You may see some blood in your urine while the stent is in place and a few days afterward. Do not be alarmed, even if the urine is clear for a while. Get off your feet and drink lots of fluids until clearing occurs. If you start to pass clots or don't improve, call us.  If you have a string coming from your urethra:  The stent string is attached to your ureteral stent.  Do not pull on thisIf you have a string coming from your urethra:  The stent string is attached to your ureteral stent.  Do not pull on this.  Diet:  You may return to your normal diet immediately. Because of the raw surface of your bladder, alcohol, spicy foods, foods high in acid and drinks with caffeine may cause irritation or frequency and should be used in moderation. To keep your urine flowing freely and avoid constipation, drink plenty of fluids during the day (8-10 glasses). Tip: Avoid cranberry juice because it is very acidic.  Activity:  Your physical activity doesn't need  to be restricted. However, if you are very active, you may see some blood in the urine. We suggest that you reduce your activity under the circumstances until the bleeding has stopped.  Bowels:  It is important to keep your bowels regular during the postoperative period. Straining with bowel movements can cause bleeding. A bowel movement every other day is reasonable. Use a mild laxative if needed, such as milk of magnesia 2-3 tablespoons, or 2 Dulcolax tablets. Call if you continue to have problems. If you had been taking narcotics for pain, before, during or after your surgery, you may be constipated. Take a laxative if necessary.     Medication:  You should resume your pre-surgery medications unless told not to. DO NOT RESUME YOUR ASPIRIN, or any other medicines like ibuprofen, motrin, excedrin, advil, aleve, vitamin E, fish oil as these can all cause bleeding x 7 days. In addition you may be given an antibiotic to prevent or treat infection. Antibiotics are not always necessary. All medication should be taken as prescribed until the bottles are finished unless you are having an unusual reaction to one of the drugs.  Problems you should report to [place]:  a. Fever greater than 101F. b. Heavy bleeding, or clots (see notes above about blood in urine). c. Inability to urinate. d. Drug reactions (hives, rash, nausea, vomiting, diarrhea). e. Severe burning or pain with urination that is not improving.  Followup:  You will need a followup appointment to monitor your progress in most cases. Please call the office for this appointment when you get home if your appointment has not already been scheduled. Usually the first appointment will be about 5-14 days after your surgery and if you have a stent in place it will likely be removed at that time.   Post Anesthesia Home Care Instructions  Activity: Get plenty of rest for the remainder of the day. A responsible individual must stay with you for 24  hours following the procedure.  For the next 24 hours, DO NOT: -Drive a car -Paediatric nurse -Drink alcoholic beverages -Take any medication unless instructed by your physician -Make any legal decisions or sign important papers.  Meals: Start with liquid foods such as gelatin or soup. Progress to regular foods as tolerated. Avoid greasy, spicy, heavy foods. If nausea and/or vomiting occur, drink only clear liquids until the nausea and/or vomiting subsides. Call your physician if vomiting continues.  Special Instructions/Symptoms: Your throat may feel dry or sore from the anesthesia or the breathing tube placed in your throat during surgery. If this causes discomfort, gargle with warm salt water. The discomfort should disappear within 24 hours.  If you had a scopolamine patch placed behind your ear for the management of post- operative nausea and/or vomiting:  1. The medication in the patch is effective for 72 hours, after which it should be removed.  Wrap patch in a tissue and discard in the trash. Wash hands thoroughly with soap and water. 2. You may remove the patch earlier than 72 hours if you experience unpleasant side effects which may include dry mouth, dizziness or visual disturbances. 3. Avoid touching the patch. Wash your hands with soap and water after contact with the patch.

## 2019-05-20 NOTE — Anesthesia Procedure Notes (Signed)
Procedure Name: Intubation Date/Time: 05/20/2019 12:03 PM Performed by: Justice Rocher, CRNA Pre-anesthesia Checklist: Patient identified, Emergency Drugs available, Suction available and Patient being monitored Patient Re-evaluated:Patient Re-evaluated prior to induction Oxygen Delivery Method: Circle system utilized Preoxygenation: Pre-oxygenation with 100% oxygen Induction Type: IV induction Ventilation: Mask ventilation without difficulty Laryngoscope Size: Mac and 3 Grade View: Grade II Tube type: Oral Tube size: 7.0 mm Number of attempts: 1 Airway Equipment and Method: Stylet and Oral airway Placement Confirmation: ETT inserted through vocal cords under direct vision,  positive ETCO2 and breath sounds checked- equal and bilateral Secured at: 22 cm Tube secured with: Tape Dental Injury: Teeth and Oropharynx as per pre-operative assessment

## 2019-05-20 NOTE — Op Note (Addendum)
PATIENT:  Joyce Kim  PRE-OPERATIVE DIAGNOSIS:  right Ureteral calculus  POST-OPERATIVE DIAGNOSIS: Same  PROCEDURE:  1. Right ureteral stent removal under fluoroscopic guidance 2. Right retrograde pyelogram 3. Right ureteral stone extraction  SURGEON: Claybon Jabs, MD  INDICATION:  58yo with a hx of nephrolithiasis who presented to the ER on 05/02/19 with abdominal pain, nausea/vomiting and fevers. She is followed by Dr. Karsten Ro for nephrolithiasis. She was diagnosed with a right ureteral calculus in May 2020 and was on medical expulsive therapy. She developed a fever of 101.6 on 6/19. CT scan from today shows a 89m right UPJ calculus with moderate hydronephrosis. Underwent ureteral stent placement on 05/03/19.  Presents today for definitive stone treatment. Discussed risks and benefits of ureteroscopic stone extraction and she would like to proceed.   ANESTHESIA:  General  EBL:  Minimal  DRAINS: None  SPECIMEN:  Right ureteral stone   DESCRIPTION OF PROCEDURE: The patient was taken to the major OR and placed on the table. General anesthesia was administered and then the patient was moved to the dorsal lithotomy position. The genitalia was sterilely prepped and draped. An official timeout was performed.  Initially the 212French cystoscope with 30 lens was passed under direct vision into the bladder. The bladder was then fully inspected. It was noted be free of any tumors, stones or inflammatory lesions. Right ureteral stent was seen eminating from the UO. Stent was grasped and then pulled out through the urethral meatus. Stent was wired with to maintain access to right ureter and stent was removed.  A 6 French open-ended ureteral catheter was then passed over the wire in order to perform a right retrograde pyelogram. A retrograde pyelogram was performed by injecting full-strength contrast up the right ureter under direct fluoroscopic control. It revealed a no filling  defect in the right renal pelvis consistent with the stone seen on the preoperative KUB. The remainder of the ureter was noted to be normal as was the intrarenal collecting system.   I then passed a 0.038 inch floppy-tipped guidewire through the open ended catheter and into the area of the renal pelvis and this was left in place.  A 6 French flexible ureteroscope was then passed over the wire and advanced up the wire into the right renal pelvis.  The stone was identified and found to be in 2 small fragments that were small enough to be removed without fragmentation. Ngage basket was used to grasp the larger piece of stone and it was removed from the ureter without difficulty.   We then replaced the ureteroscope into the bladder and visualized the right UO. Using the aid of a guidewire the UO was intubated and we advanced the ureteroscope into the right renal pelvis.   Pan pyeloscopy (fluroscopic guided) was then performed and no additional stone fragments were found. The smaller stone fragment was thought to have passed with removal of the first stone fragment. Pull out ureteroscopy showed no sign of ureteral injury or residual stone.   The bladder was drained and the cystoscope was then removed. The patient tolerated the procedure well no intraoperative complications.  PLAN OF CARE: Discharge to home after PACU  PATIENT DISPOSITION:  PACU - hemodynamically stable.

## 2019-05-20 NOTE — Anesthesia Postprocedure Evaluation (Signed)
Anesthesia Post Note  Patient: Joyce Kim  Procedure(s) Performed: STENT REMOVAL /URETEROSCOPY/ STONE EXTRACTION (Right Ureter)     Patient location during evaluation: PACU Anesthesia Type: General Level of consciousness: awake and alert Pain management: pain level controlled Vital Signs Assessment: post-procedure vital signs reviewed and stable Respiratory status: spontaneous breathing, nonlabored ventilation, respiratory function stable and patient connected to nasal cannula oxygen Cardiovascular status: blood pressure returned to baseline and stable Postop Assessment: no apparent nausea or vomiting Anesthetic complications: no    Last Vitals:  Vitals:   05/20/19 1315 05/20/19 1330  BP: 132/69 139/70  Pulse: 60 (!) 57  Resp: 14 18  Temp:    SpO2: 95% 92%    Last Pain:  Vitals:   05/20/19 1345  TempSrc:   PainSc: 0-No pain                 Tiajuana Amass

## 2019-05-20 NOTE — Interval H&P Note (Signed)
History and Physical Interval Note:  05/20/2019 11:25 AM  Joyce Kim  has presented today for surgery, with the diagnosis of RIGHT URETERAL STONE WITH STENT.  The various methods of treatment have been discussed with the patient and family. After consideration of risks, benefits and other options for treatment, the patient has consented to  Procedure(s): STENT REMOVAL /URETEROSCOPY/ POSSIBLE HOLMIUM LASER LITHOTRIPSY WITH POSSIBLE STENT REPLACEMENT (Right) as a surgical intervention.  The patient's history has been reviewed, patient examined, no change in status, stable for surgery.  I have reviewed the patient's chart and labs.  Questions were answered to the patient's satisfaction.     Claybon Jabs

## 2019-05-21 ENCOUNTER — Encounter (HOSPITAL_BASED_OUTPATIENT_CLINIC_OR_DEPARTMENT_OTHER): Payer: Self-pay | Admitting: Urology

## 2019-07-05 ENCOUNTER — Encounter: Payer: Self-pay | Admitting: Gastroenterology

## 2019-07-10 ENCOUNTER — Encounter: Payer: Self-pay | Admitting: Internal Medicine

## 2019-07-10 ENCOUNTER — Ambulatory Visit: Payer: BC Managed Care – PPO | Admitting: Internal Medicine

## 2019-07-10 VITALS — BP 130/90 | HR 60 | Temp 97.9°F | Ht 66.0 in | Wt 275.0 lb

## 2019-07-10 DIAGNOSIS — K5150 Left sided colitis without complications: Secondary | ICD-10-CM | POA: Diagnosis not present

## 2019-07-10 DIAGNOSIS — Z8619 Personal history of other infectious and parasitic diseases: Secondary | ICD-10-CM

## 2019-07-10 NOTE — Assessment & Plan Note (Addendum)
Not responding to 4.8 g Lialda daily Colonoscopy to reassess disease and plan treatment The risks and benefits as well as alternatives of endoscopic procedure(s) have been discussed and reviewed. All questions answered. The patient agrees to proceed.

## 2019-07-10 NOTE — Patient Instructions (Signed)
You have been scheduled for a colonoscopy. Please follow written instructions given to you at your visit today.  Please pick up your prep supplies at the pharmacy within the next 1-3 days. If you use inhalers (even only as needed), please bring them with you on the day of your procedure.   I appreciate the opportunity to care for you. Silvano Rusk, MD, [facility]

## 2019-07-10 NOTE — Progress Notes (Signed)
Joyce Kim [name] 58 y.o. Mar 02, 1961 [id number]  Assessment & Plan:  Left sided ulcerative (chronic) colitis ([place]) Not responding to 4.8 g Lialda daily Colonoscopy to reassess disease and plan treatment The risks and benefits as well as alternatives of endoscopic procedure(s) have been discussed and reviewed. All questions answered. The patient agrees to proceed.       Subjective:   Chief Complaint: diarrhea, ulcerative colitis  HPI Elim has been on Lialda 4.8 g daily since 2018019 and did initially improve but for several mos or more is struggling with persistent diarrhea and some abdominal cramps. Loperamide not helpful though taken in small quantities. She was treated with prednisone in March and at f/u in May was not really improved. She had labs and CT abd/pelvis then related to sxs and concern about FHx pancreatic cancer and her risks. CT showed some atrophy and fatty replacement of pancreas but also R ureterolithiasis and hydronpehrosis + left nephrolithiasis. She saw GU but in June was admitted with pyelonephritis. Infection w/u diarrhea then was negative. CT scan again - colon featureless and thickened in ascending and transverse.  She is recovered from the pyelonephritis after having GU stone removal, stent and removal.   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    Allergies  Allergen Reactions  . Sulfonamide Derivatives Shortness Of Breath and Rash  . Contrast Media [Iodinated Diagnostic Agents] Hives    Patient takes Benadryl when getting the diagnostic agents, seems to be fine after.  . Doxycycline Nausea And Vomiting   Current Meds  Medication Sig  . aspirin 81 MG tablet Take 81 mg by mouth daily.  . cetirizine (ZYRTEC) 10 MG tablet Take 10 mg by mouth daily.  . Cranberry 125 MG TABS Take 1 tablet by mouth daily.  Marland Kitchen  escitalopram (LEXAPRO) 10 MG tablet Take 10 mg by mouth daily.  . famotidine (PEPCID) 20 MG tablet Take 20 mg by mouth 2 (two) times daily.  Marland Kitchen levothyroxine (SYNTHROID, LEVOTHROID) 50 MCG tablet Take 50 mcg by mouth daily before breakfast.  . loperamide (IMODIUM A-D) 2 MG tablet Take 1 mg by mouth 2 (two) times daily as needed for diarrhea or loose stools.  . mesalamine (LIALDA) 1.2 g EC tablet TAKE 2 TABLETS BY MOUTH TWICE DAILY WITH A MEAL (Patient taking differently: Take 2.4 g by mouth 2 (two) times daily with a meal. )  . Multiple Vitamin (MULTIVITAMIN) capsule Take 1 capsule by mouth daily.    . Saccharomyces boulardii (PROBIOTIC) 250 MG CAPS Take 1 capsule by mouth daily.   Past Medical History:  Diagnosis Date  . Allergy    seasonal  . Anemia   . Arthritis   . Bacteremia due to Gram-negative bacteria   . Barrett esophagus   . Blood transfusion abn reaction or complication, no procedure mishap 1968   after kidney surgery  . Blood transfusion without reported diagnosis   . C. difficile colitis   . C. difficile diarrhea   . DJD (degenerative joint disease)    fingers and ankle  . GERD (gastroesophageal reflux disease)   . H. pylori infection   . Heart murmur   . History of kidney stones   . Hypothyroidism   . Left sided ulcerative (chronic) colitis ([place]) 02/08/2019  . Obesity   . Obstruction of right ureteropelvic junction (UPJ) due to stone   .  Onychomycosis   . Overactive bladder   . Plantar fasciitis    left foot  . Pyelonephritis 05/02/2019  . RBBB   . Reflux   . Sepsis secondary to UTI ([place])   . Ulcer    Past Surgical History:  Procedure Laterality Date  . CHOLECYSTECTOMY  2002  . COLONOSCOPY    . CYSTOSCOPY W/ URETERAL STENT PLACEMENT Right 05/03/2019   Procedure: CYSTOSCOPY WITH RETROGRADE PYELOGRAM/URETERAL STENT PLACEMENT;  Surgeon: Cleon Gustin, MD;  Location: [facility];  Service: Urology;  Laterality: Right;  . ESOPHAGOGASTRODUODENOSCOPY (EGD) WITH  PROPOFOL N/A 01/17/2017   Procedure: ESOPHAGOGASTRODUODENOSCOPY (EGD) WITH PROPOFOL;  Surgeon: Johnathan Hausen, MD;  Location: [facility];  Service: General;  Laterality: N/A;  . KIDNEY SURGERY  1966   relux from ureter corrected  . LAPAROSCOPIC GASTRIC BANDING  08/07/2007   repair hiatal hernia, Dr Hassell Done  . UPPER GASTROINTESTINAL ENDOSCOPY    . 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   Review of Systems As above  Objective:   Physical Exam @BP  130/90   Pulse 60   Temp 97.9 F (36.6 C) (Oral)   Ht 5' 6"  (1.676 m)   Wt 275 lb (124.7 kg)   BMI 44.39 kg/m @  General:  NAD Eyes:   anicteric Lungs:  clear Heart::  S1S2 no rubs, murmurs or gallops Abdomen:  soft and nontender, BS+ Ext:   no edema, cyanosis or clubbing    Data Reviewed:  See HPI  15 minutes time spent with patient > half in counseling coordination of care

## 2019-07-12 ENCOUNTER — Encounter: Payer: Self-pay | Admitting: Internal Medicine

## 2019-07-15 ENCOUNTER — Telehealth: Payer: Self-pay

## 2019-07-15 NOTE — Telephone Encounter (Signed)
Covid-19 screening questions   Do you now or have you had a fever in the last 14 days?  Do you have any respiratory symptoms of shortness of breath or cough now or in the last 14 days?  Do you have any family members or close contacts with diagnosed or suspected Covid-19 in the past 14 days?  Have you been tested for Covid-19 and found to be positive?       

## 2019-07-16 ENCOUNTER — Other Ambulatory Visit: Payer: Self-pay

## 2019-07-16 ENCOUNTER — Ambulatory Visit (AMBULATORY_SURGERY_CENTER): Payer: BC Managed Care – PPO | Admitting: Internal Medicine

## 2019-07-16 ENCOUNTER — Encounter: Payer: Self-pay | Admitting: Internal Medicine

## 2019-07-16 VITALS — BP 122/59 | HR 53 | Temp 97.8°F | Resp 16 | Ht 66.0 in | Wt 275.0 lb

## 2019-07-16 DIAGNOSIS — K529 Noninfective gastroenteritis and colitis, unspecified: Secondary | ICD-10-CM

## 2019-07-16 DIAGNOSIS — K6289 Other specified diseases of anus and rectum: Secondary | ICD-10-CM | POA: Diagnosis not present

## 2019-07-16 DIAGNOSIS — K5150 Left sided colitis without complications: Secondary | ICD-10-CM | POA: Diagnosis present

## 2019-07-16 MED ORDER — SODIUM CHLORIDE 0.9 % IV SOLN: 500.0000 mL | Freq: Once | INTRAVENOUS | Status: DC

## 2019-07-16 MED ORDER — MESALAMINE 1.2 G PO TBEC: 2.4000 g | DELAYED_RELEASE_TABLET | Freq: Every day | ORAL | 3 refills | Status: AC

## 2019-07-16 NOTE — Patient Instructions (Addendum)
The colon looks good. Some inflammation in the rectum but I think that was just some mild trauma and not active inflammation.  I took biopsies and will let you know.  As we discussed - sometimes the medication can cause diarrhea.  Reduce the Lialda to 2.4 g or two tabs a day and see if that makes a difference.  I appreciate the opportunity to care for you. Gatha Mayer, MD, [facility]  Await pathology results.  YOU HAD AN ENDOSCOPIC PROCEDURE TODAY AT [facility]:   Refer to the procedure report that was given to you for any specific questions about what was found during the examination.  If the procedure report does not answer your questions, please call your gastroenterologist to clarify.  If you requested that your care partner not be given the details of your procedure findings, then the procedure report has been included in a sealed envelope for you to review at your convenience later.  YOU SHOULD EXPECT: Some feelings of bloating in the abdomen. Passage of more gas than usual.  Walking can help get rid of the air that was put into your GI tract during the procedure and reduce the bloating. If you had a lower endoscopy (such as a colonoscopy or flexible sigmoidoscopy) you may notice spotting of blood in your stool or on the toilet paper. If you underwent a bowel prep for your procedure, you may not have a normal bowel movement for a few days.  Please Note:  You might notice some irritation and congestion in your nose or some drainage.  This is from the oxygen used during your procedure.  There is no need for concern and it should clear up in a day or so.  SYMPTOMS TO REPORT IMMEDIATELY:   Following lower endoscopy (colonoscopy or flexible sigmoidoscopy):  Excessive amounts of blood in the stool  Significant tenderness or worsening of abdominal pains  Swelling of the abdomen that is new, acute  Fever of 100F or higher  For urgent or emergent issues, a gastroenterologist  can be reached at any hour by calling [phone].   DIET:  We do recommend a small meal at first, but then you may proceed to your regular diet.  Drink plenty of fluids but you should avoid alcoholic beverages for 24 hours.  ACTIVITY:  You should plan to take it easy for the rest of today and you should NOT DRIVE or use heavy machinery until tomorrow (because of the sedation medicines used during the test).    FOLLOW UP: Our staff will call the number listed on your records 48-72 hours following your procedure to check on you and address any questions or concerns that you may have regarding the information given to you following your procedure. If we do not reach you, we will leave a message.  We will attempt to reach you two times.  During this call, we will ask if you have developed any symptoms of COVID 19. If you develop any symptoms (ie: fever, flu-like symptoms, shortness of breath, cough etc.) before then, please call [phone].  If you test positive for Covid 19 in the 2 weeks post procedure, please call and report this information to [place].    If any biopsies were taken you will be contacted by phone or by letter within the next 1-3 weeks.  Please call us at [phone] if you have not heard about the biopsies in 3 weeks.    SIGNATURES/CONFIDENTIALITY: You and/or your care  partner have signed paperwork which will be entered into your electronic medical record.  These signatures attest to the fact that that the information above on your After Visit Summary has been reviewed and is understood.  Full responsibility of the confidentiality of this discharge information lies with you and/or your care-partner.

## 2019-07-16 NOTE — Op Note (Signed)
[employer] Patient Name: Joyce Kim [facility] Procedure Date: 07/16/2019 1:12 PM MRN: [id number] Endoscopist: Gatha Mayer , MD Age: 58 Referring MD:  Date of Birth: 01-11-1961 Gender: Female Account #: [id number] Procedure:                Colonoscopy Indications:              Left-sided chronic ulcerative colitis, Follow-up of                            left-sided chronic ulcerative colitis, Disease                            activity assessment of left-sided chronic                            ulcerative colitis, Assess therapeutic response to                            therapy of left-sided chronic ulcerative colitis Medicines:                Propofol per Anesthesia, Monitored Anesthesia Care Procedure:                Pre-Anesthesia Assessment:                           - Prior to the procedure, a History and Physical                            was performed, and patient medications and                            allergies were reviewed. The patient's tolerance of                            previous anesthesia was also reviewed. The risks                            and benefits of the procedure and the sedation                            options and risks were discussed with the patient.                            All questions were answered, and informed consent                            was obtained. Prior Anticoagulants: The patient has                            taken no previous anticoagulant or antiplatelet                            agents. ASA Grade Assessment: II - A patient with  mild systemic disease. After reviewing the risks                            and benefits, the patient was deemed in                            satisfactory condition to undergo the procedure.                           After obtaining informed consent, the colonoscope                            was passed under direct vision. Throughout the            procedure, the patient's blood pressure, pulse, and                            oxygen saturations were monitored continuously. The                            Colonoscope was introduced through the anus and                            advanced to the the cecum, identified by                            appendiceal orifice and ileocecal valve. The                            colonoscopy was somewhat difficult due to                            significant looping. Successful completion of the                            procedure was aided by applying abdominal pressure.                            The patient tolerated the procedure well. The                            quality of the bowel preparation was good. The                            ileocecal valve, appendiceal orifice, and rectum                            were photographed. The bowel preparation used was                            Miralax via split dose instruction. The quality of                            the bowel preparation was good. Scope In: 1:42:52 PM Scope  Out: 1:56:17 PM Scope Withdrawal Time: 0 hours 8 minutes 43 seconds  Total Procedure Duration: 0 hours 13 minutes 25 seconds  Findings:                 The perianal and digital rectal examinations were                            normal.                           A localized area of mildly petechial mucosa was                            found in the rectum. Biopsies were taken with a                            cold forceps for histology. Verification of patient                            identification for the specimen was done. Estimated                            blood loss was minimal.                           A few diverticula were found in the sigmoid colon.                           The exam was otherwise without abnormality on                            direct and retroflexion views.                           Biopsies were taken with a cold forceps in the                             rectum, in the sigmoid colon, in the descending                            colon, in the transverse colon and in the ascending                            colon for histology. Complications:            No immediate complications. Estimated Blood Loss:     Estimated blood loss was minimal. Estimated blood                            loss was minimal. Impression:               - Petechial mucosa in the rectum. Biopsied.                           - Diverticulosis in the sigmoid colon.                           -  The examination was otherwise normal on direct                            and retroflexion views.                           - Biopsies were taken with a cold forceps for                            histology in the rectum, in the sigmoid colon, in                            the descending colon, in the transverse colon and                            in the ascending colon. Recommendation:           - Patient has a contact number available for                            emergencies. The signs and symptoms of potential                            delayed complications were discussed with the                            patient. Return to normal activities tomorrow.                            Written discharge instructions were provided to the                            patient.                           - Resume previous diet.                           - Continue present medications.                           - Await pathology results.                           - Repeat colonoscopy is recommended. The                            colonoscopy date will be determined after pathology                            results from today's exam become available for                            review.                           -  Try reducng Lialda to 2.4 g/day to see if                            diarrhea lessens Gatha Mayer, MD 07/16/2019 2:09:22 PM This report has been signed  electronically.

## 2019-07-16 NOTE — Progress Notes (Signed)
Called to room to assist during endoscopic procedure.  Patient ID and intended procedure confirmed with present staff. Received instructions for my participation in the procedure from the performing physician.  

## 2019-07-16 NOTE — Progress Notes (Signed)
To PACU, VSS. Report to [email or web address] 

## 2019-07-18 ENCOUNTER — Telehealth: Payer: Self-pay | Admitting: *Deleted

## 2019-07-18 NOTE — Telephone Encounter (Signed)
Left message on f/u call 

## 2019-07-19 NOTE — Progress Notes (Signed)
[facility] - no letter, recall colon   Office  Please tell her no active UC - ask her if she can tell if reduced Lialda dose is helping (less diarrhea) If not stop the Lialda and have her call me  Next week.,  Regardless make OV f/u mid-Oct

## 2020-01-12 ENCOUNTER — Ambulatory Visit: Payer: BC Managed Care – PPO

## 2020-01-19 ENCOUNTER — Ambulatory Visit: Payer: BC Managed Care – PPO | Attending: Internal Medicine

## 2020-01-19 DIAGNOSIS — Z23 Encounter for immunization: Secondary | ICD-10-CM | POA: Insufficient documentation

## 2020-01-19 NOTE — Progress Notes (Signed)
   Covid-19 Vaccination Clinic  Name:  Joyce Kim    MRN: [id number] DOB: 1960/12/01  01/19/2020  Ms. Joyce Kim was observed post Covid-19 immunization for 30 minutes based on pre-vaccination screening without incident. She was provided with Vaccine Information Sheet and instruction to access the V-Safe system.   Ms. Joyce Kim was instructed to call 911 with any severe reactions post vaccine: Marland Kitchen Difficulty breathing  . Swelling of face and throat  . A fast heartbeat  . A bad rash all over body  . Dizziness and weakness   Immunizations Administered    Name Date Dose VIS Date Route   Pfizer COVID-19 Vaccine 01/19/2020  5:06 PM 0.3 mL 10/25/2019 Intramuscular   Manufacturer: [employer]   Lot: [id number]   [place]: [address]-3

## 2020-02-09 ENCOUNTER — Ambulatory Visit: Payer: BC Managed Care – PPO | Attending: Internal Medicine

## 2020-02-09 DIAGNOSIS — Z23 Encounter for immunization: Secondary | ICD-10-CM

## 2020-02-09 NOTE — Progress Notes (Signed)
   Covid-19 Vaccination Clinic  Name:  Joyce Kim    MRN: [id number] DOB: 11-24-1960  02/09/2020  Ms. Joyce Kim was observed post Covid-19 immunization for 30 minutes based on pre-vaccination screening without incident. She was provided with Vaccine Information Sheet and instruction to access the V-Safe system.   Ms. Joyce Kim was instructed to call 911 with any severe reactions post vaccine: Marland Kitchen Difficulty breathing  . Swelling of face and throat  . A fast heartbeat  . A bad rash all over body  . Dizziness and weakness   Immunizations Administered    Name Date Dose VIS Date Route   Pfizer COVID-19 Vaccine 02/09/2020  3:16 PM 0.3 mL 10/25/2019 Intramuscular   Manufacturer: [place]   Lot: [id number]   [place]: [address]-3

## 2021-04-22 IMAGING — MR MRI HEAD WITHOUT CONTRAST
11 series · 48 of 48 positions shown · non-contrast
Comparison: None.

CLINICAL DATA: Initial evaluation for dizziness, nausea, headaches
for past 6-8 weeks.

EXAM:
MRI HEAD WITHOUT CONTRAST
TECHNIQUE: Multiplanar, multiecho pulse sequences of the brain and surrounding
structures were obtained without intravenous contrast.

[Series 2: t1_se_sag · sagittal · 5.0mm · 0.45mm/px · 2 of 21 slices shown]
[im 1/21]
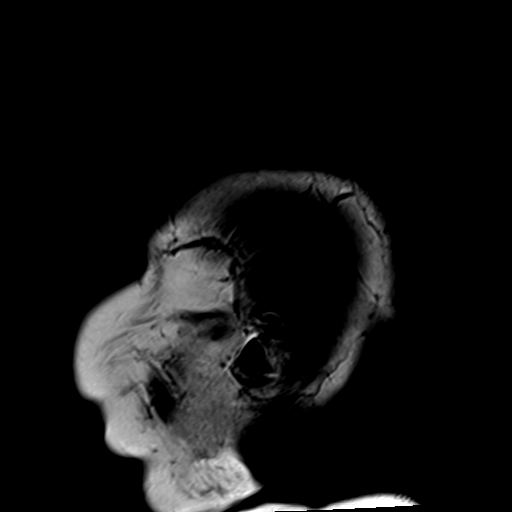
[im 21/21]
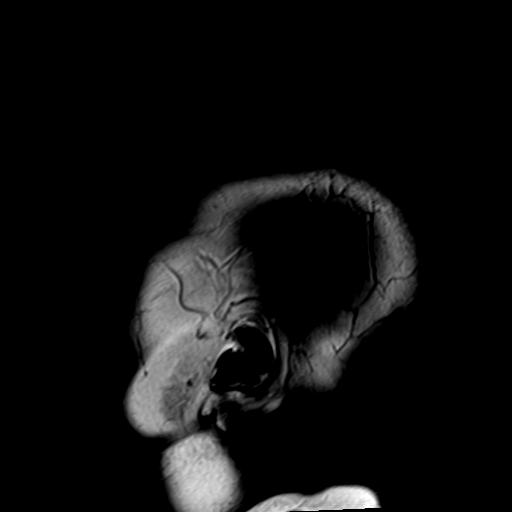

[Series 3: ep2d_diff_(id)_trace · axial · 3.0mm · 1.80mm/px · z∈[-25,+115]mm · 8 of 95 slices shown]
[im 1/95]
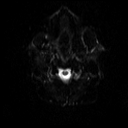
[im 14/95]
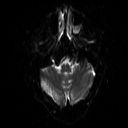
[im 27/95]
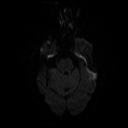
[im 41/95]
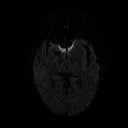
[im 54/95]
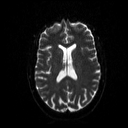
[im 68/95]
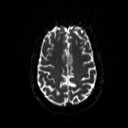
[im 81/95]
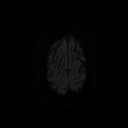
[im 95/95]
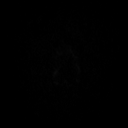

[Series 4: ep2d_diff_(id)_trace_adc · axial · 3.0mm · 1.80mm/px · z∈[-25,+115]mm · 4 of 46 slices shown]
[im 1/46]
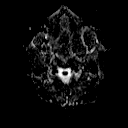
[im 16/46]
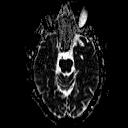
[im 31/46]
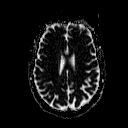
[im 46/46]
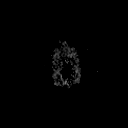

[Series 5: ep2d_diff_cor · coronal · 5.0mm · 1.77mm/px · 4 of 53 slices shown]
[im 1/53]
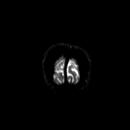
[im 18/53]
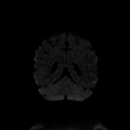
[im 35/53]
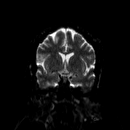
[im 53/53]
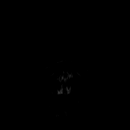

[Series 6: ep2d_diff_cor_adc · coronal · 5.0mm · 1.77mm/px · 2 of 26 slices shown]
[im 1/26]
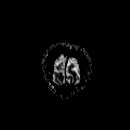
[im 26/26]
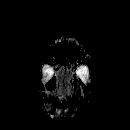

[Series 8: swi_images · axial · 2.0mm · 0.90mm/px · z∈[-38,+118]mm · 7 of 80 slices shown]
[im 1/80]
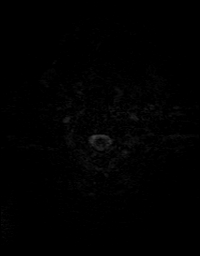
[im 14/80]
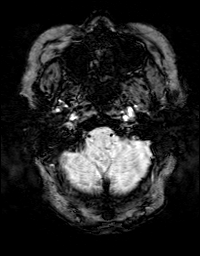
[im 27/80]
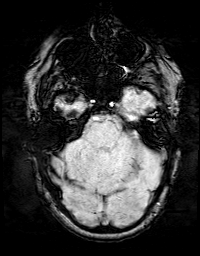
[im 40/80]
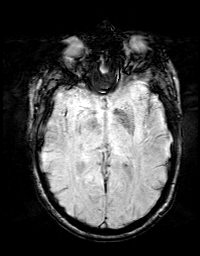
[im 53/80]
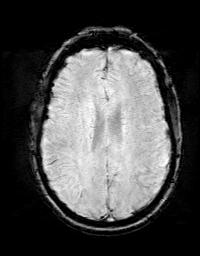
[im 66/80]
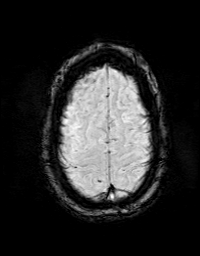
[im 80/80]
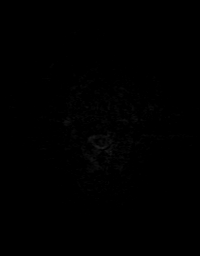

[Series 9: FLAIR · axial · 3.0mm · 0.43mm/px · z∈[-33,+122]mm · 2 of 27 slices shown (1 of 2)]
[im 1/27]
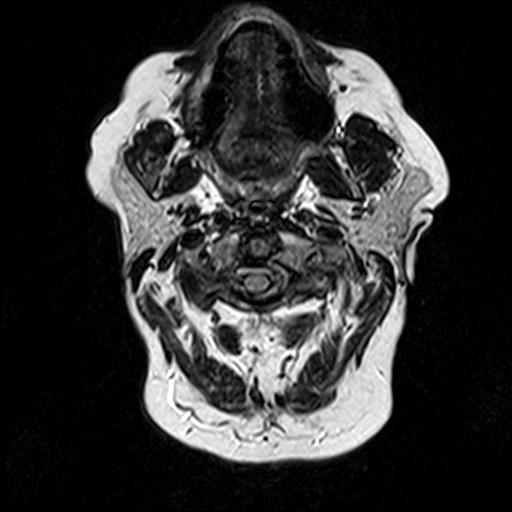
[im 27/27]
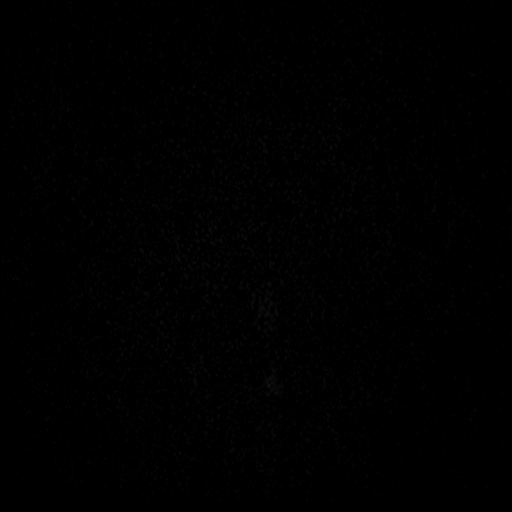

[Series 10: t2_tse_tra_512 · axial · 5.0mm · 0.60mm/px · z∈[-23,+114]mm · 2 of 24 slices shown]
[im 1/24]
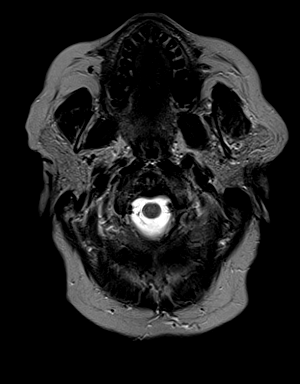
[im 24/24]
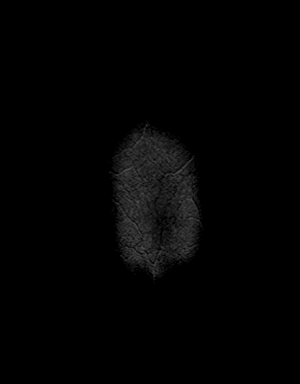

[Series 11: t1_mpr_tra · axial · 1.0mm · 0.72mm/px · z∈[-33,+124]mm · 13 of 160 slices shown]
[im 1/160]
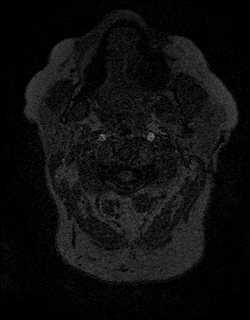
[im 14/160]
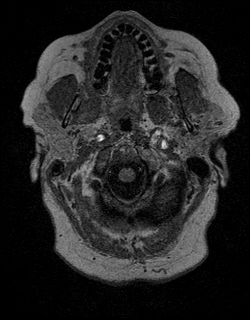
[im 27/160]
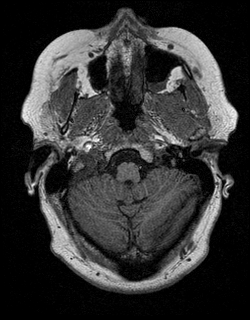
[im 40/160]
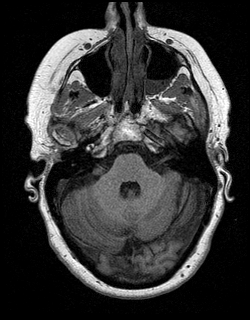
[im 54/160]
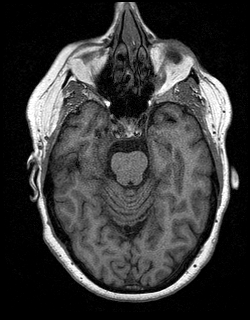
[im 67/160]
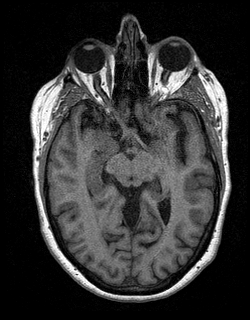
[im 80/160]
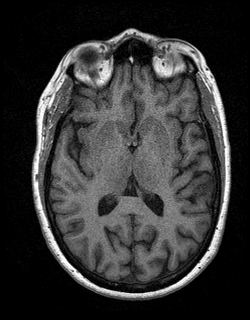
[im 93/160]
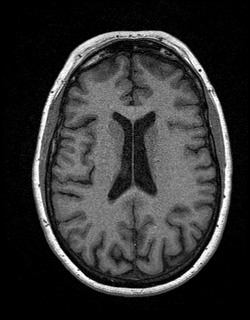
[im 107/160]
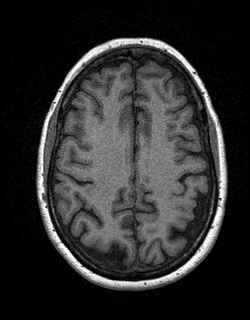
[im 120/160]
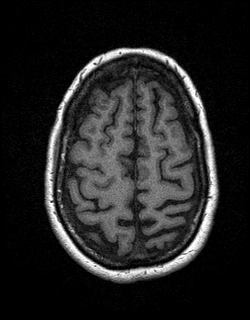
[im 133/160]
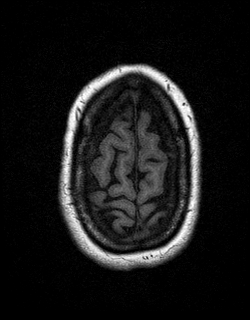
[im 146/160]
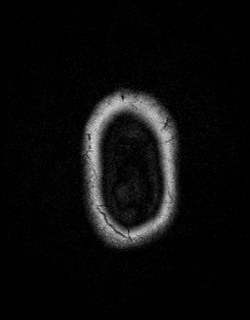
[im 160/160]
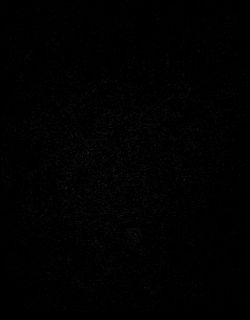

[Series 12: T2 · coronal · 5.0mm · 0.45mm/px · 2 of 30 slices shown]
[im 1/30]
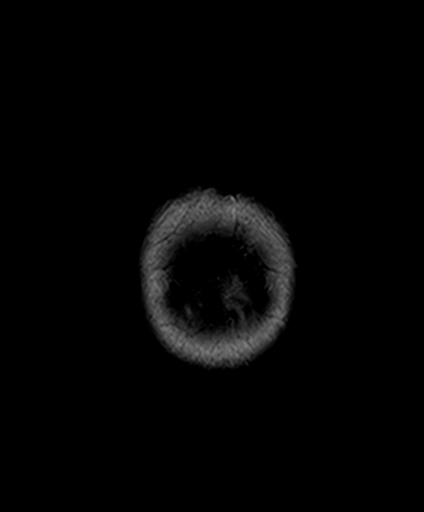
[im 30/30]
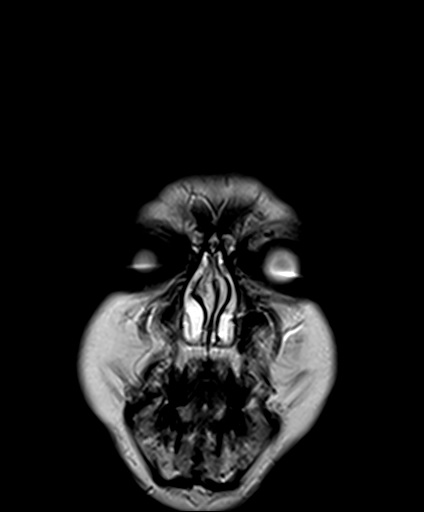

[Series 13: FLAIR · sagittal · 5.0mm · 0.45mm/px · 2 of 25 slices shown (2 of 2)]
[im 1/25]
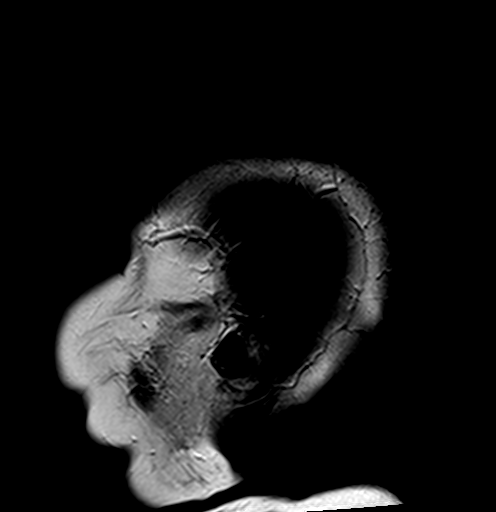
[im 25/25]
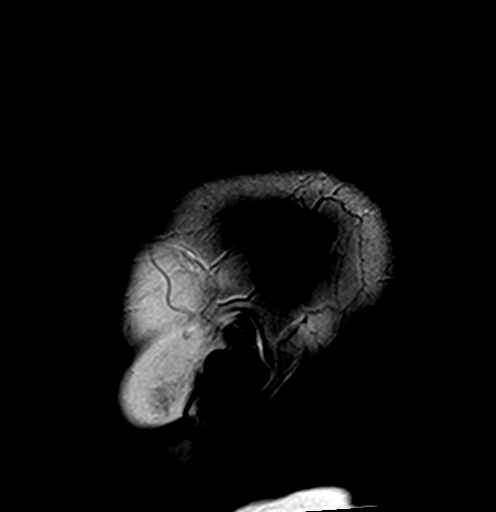

[48 of 48 positions shown; findings below may reference images not displayed]

FINDINGS: Brain: Cerebral volume within normal limits for patient age. Few
scattered subcentimeter T2/FLAIR hyperintensities noted within the
periventricular and deep white matter both cerebral hemispheres,
nonspecific, but felt to be within normal limits for age.

No abnormal foci of restricted diffusion to suggest acute or
subacute ischemia. Gray-white matter differentiation well
maintained. No encephalomalacia to suggest chronic infarction. No
foci of susceptibility artifact to suggest acute or chronic
intracranial hemorrhage.

No mass lesion, midline shift or mass effect. No hydrocephalus. No
extra-axial fluid collection. Major dural sinuses are grossly
patent.

Pituitary gland and suprasellar region are normal. Midline
structures intact and normal.

Vascular: Major intracranial vascular flow voids well maintained and
normal in appearance.

Skull and upper cervical spine: Craniocervical junction normal.
Visualized upper cervical spine within normal limits. Bone marrow
signal intensity normal. No scalp soft tissue abnormality.

Sinuses/Orbits: Globes and orbital soft tissues within normal
limits.

Air-fluid level noted within the left maxillary sinus, compatible
with acute sinusitis. Paranasal sinuses are otherwise clear. No
mastoid effusion. Inner ear structures normal.

Other: None.
IMPRESSION: 1. Normal brain MRI for age. No acute intracranial abnormality
identified.
2. Acute left maxillary sinusitis.

## 2021-12-12 ENCOUNTER — Emergency Department (HOSPITAL_COMMUNITY): Payer: BC Managed Care – PPO

## 2021-12-12 ENCOUNTER — Encounter (HOSPITAL_COMMUNITY): Payer: Self-pay

## 2021-12-12 ENCOUNTER — Other Ambulatory Visit: Payer: Self-pay

## 2021-12-12 ENCOUNTER — Emergency Department (HOSPITAL_COMMUNITY)
Admission: EM | Admit: 2021-12-12 | Discharge: 2021-12-13 | Disposition: A | Discharge status: Home / Self Care | Payer: BC Managed Care – PPO | Attending: Emergency Medicine | Admitting: Emergency Medicine

## 2021-12-12 DIAGNOSIS — Z20822 Contact with and (suspected) exposure to covid-19: Secondary | ICD-10-CM | POA: Diagnosis not present

## 2021-12-12 DIAGNOSIS — R0602 Shortness of breath: Secondary | ICD-10-CM | POA: Diagnosis present

## 2021-12-12 DIAGNOSIS — Z7982 Long term (current) use of aspirin: Secondary | ICD-10-CM | POA: Diagnosis not present

## 2021-12-12 DIAGNOSIS — J188 Other pneumonia, unspecified organism: Secondary | ICD-10-CM | POA: Insufficient documentation

## 2021-12-12 DIAGNOSIS — N3001 Acute cystitis with hematuria: Secondary | ICD-10-CM | POA: Insufficient documentation

## 2021-12-12 DIAGNOSIS — M791 Myalgia, unspecified site: Secondary | ICD-10-CM | POA: Diagnosis not present

## 2021-12-12 DIAGNOSIS — J189 Pneumonia, unspecified organism: Secondary | ICD-10-CM

## 2021-12-12 DIAGNOSIS — Z79899 Other long term (current) drug therapy: Secondary | ICD-10-CM | POA: Insufficient documentation

## 2021-12-12 LAB — CBC WITH DIFFERENTIAL/PLATELET
Abs Immature Granulocytes: 0.06 10*3/uL (ref 0.00–0.07)
Basophils Absolute: 0.1 10*3/uL (ref 0.0–0.1)
Basophils Relative: 1 %
Eosinophils Absolute: 0.7 10*3/uL — ABNORMAL HIGH (ref 0.0–0.5)
Eosinophils Relative: 5 %
HCT: 38.7 % (ref 36.0–46.0)
Hemoglobin: 12.4 g/dL (ref 12.0–15.0)
Immature Granulocytes: 0 %
Lymphocytes Relative: 18 %
Lymphs Abs: 2.5 10*3/uL (ref 0.7–4.0)
MCH: 28.8 pg (ref 26.0–34.0)
MCHC: 32 g/dL (ref 30.0–36.0)
MCV: 90 fL (ref 80.0–100.0)
Monocytes Absolute: 1.1 10*3/uL — ABNORMAL HIGH (ref 0.1–1.0)
Monocytes Relative: 8 %
Neutro Abs: 9.6 10*3/uL — ABNORMAL HIGH (ref 1.7–7.7)
Neutrophils Relative %: 68 %
Platelets: 473 10*3/uL — ABNORMAL HIGH (ref 150–400)
RBC: 4.3 MIL/uL (ref 3.87–5.11)
RDW: 14.6 % (ref 11.5–15.5)
WBC: 14 10*3/uL — ABNORMAL HIGH (ref 4.0–10.5)
nRBC: 0 % (ref 0.0–0.2)

## 2021-12-12 LAB — COMPREHENSIVE METABOLIC PANEL
ALT: 12 U/L (ref 0–44)
AST: 20 U/L (ref 15–41)
Albumin: 3.5 g/dL (ref 3.5–5.0)
Alkaline Phosphatase: 123 U/L (ref 38–126)
Anion gap: 11 (ref 5–15)
BUN: 12 mg/dL (ref 6–20)
CO2: 25 mmol/L (ref 22–32)
Calcium: 9.1 mg/dL (ref 8.9–10.3)
Chloride: 99 mmol/L (ref 98–111)
Creatinine, Ser: 0.73 mg/dL (ref 0.44–1.00)
GFR, Estimated: >60 mL/min (ref >60)
Glucose, Bld: 129 mg/dL — ABNORMAL HIGH (ref 70–99)
Potassium: 3.2 mmol/L — ABNORMAL LOW (ref 3.5–5.1)
Sodium: 135 mmol/L (ref 135–145)
Total Bilirubin: 1.2 mg/dL (ref 0.3–1.2)
Total Protein: 7.8 g/dL (ref 6.5–8.1)

## 2021-12-12 LAB — RESP PANEL BY RT-PCR (FLU A&B, COVID) ARPGX2
Influenza A by PCR: NEGATIVE
Influenza B by PCR: NEGATIVE
SARS Coronavirus 2 by RT PCR: NEGATIVE

## 2021-12-12 NOTE — ED Notes (Addendum)
Pt NAD in bed, a/ox4. Speaking in full and complete sentences.. +tachypnea. Pt states she has been SOB with cough, right sided CP that radiates to right axillary and increases on inspiration and coughing, x 7 days. LS clear.

## 2021-12-12 NOTE — ED Triage Notes (Signed)
Pt reports with [facility] and generalized body aches  x 1 week. Pt states that she hasn't been eating and drinking as usual.

## 2021-12-12 NOTE — ED Provider Notes (Signed)
[facility] DEPT Provider Note   CSN: [id number] Arrival date & time: 12/12/21  2147     History  Chief Complaint  Patient presents with   Generalized Body Aches   Shortness of Breath    Joyce Kim is a 61 y.o. female.  The history is provided by the patient and medical records.  Shortness of Breath  61 year old female with history of Barrett's esophagus, GERD, ulcerative colitis, arthritis, anemia, seasonal allergies, presenting to the ED with 1 week of body aches and shortness of breath.  States she has been feeling poorly and has not really been eating and drinking as much as she should.  She has had some occasional cough but no mucus production.  She has not had vomiting or diarrhea.  She denies any known sick contacts.  She has had COVID and flu vaccinations.  Has been using some over-the-counter meds without relief.  Home Medications Prior to Admission medications   Medication Sig Start Date End Date Taking? Authorizing Provider  aspirin 81 MG tablet Take 81 mg by mouth daily.    [provider]  cetirizine (ZYRTEC) 10 MG tablet Take 10 mg by mouth daily.    [provider]  Cranberry 125 MG TABS Take 1 tablet by mouth daily.    [provider]  escitalopram (LEXAPRO) 10 MG tablet Take 10 mg by mouth daily. 03/17/19   [provider]  famotidine (PEPCID) 20 MG tablet Take 20 mg by mouth 2 (two) times daily.    [provider]  HYDROcodone-acetaminophen (NORCO) 10-325 MG tablet Take 1-2 tablets by mouth every 4 (four) hours as needed for moderate pain. Maximum dose per 24 hours - 8 pills 05/20/19   Kathie Rhodes, MD  levothyroxine (SYNTHROID, LEVOTHROID) 50 MCG tablet Take 50 mcg by mouth daily before breakfast.    [provider]  loperamide (IMODIUM A-D) 2 MG tablet Take 1 mg by mouth 2 (two) times daily as needed for diarrhea or loose stools.    [provider]  mesalamine  (LIALDA) 1.2 g EC tablet Take 2 tablets (2.4 g total) by mouth daily with breakfast. 07/16/19   Gatha Mayer, MD  Multiple Vitamin (MULTIVITAMIN) capsule Take 1 capsule by mouth daily.      [provider]  phenazopyridine (PYRIDIUM) 200 MG tablet Take 1 tablet (200 mg total) by mouth 3 (three) times daily as needed for pain. 05/20/19   Kathie Rhodes, MD  potassium chloride SA (K-DUR) 20 MEQ tablet Take 1 tablet (20 mEq total) by mouth every Monday, Wednesday, and Friday for 14 doses. 05/08/19 06/08/19  Florencia Reasons, MD  Saccharomyces boulardii (PROBIOTIC) 250 MG CAPS Take 1 capsule by mouth daily.    [provider]  vitamin C (ASCORBIC ACID) 500 MG tablet Take 500 mg by mouth 2 (two) times daily.    [provider]      Allergies    Sulfonamide derivatives, Contrast media [iodinated contrast media], and Doxycycline    Review of Systems   Review of Systems  Respiratory:  Positive for shortness of breath.   All other systems reviewed and are negative.  Physical Exam Updated Vital Signs BP 123/81 (BP Location: Left Arm)    Pulse 93    Temp 98.5 F (36.9 C) (Oral)    Resp 18    SpO2 95%   Physical Exam Vitals and nursing note reviewed.  Constitutional:      Appearance: She is well-developed.  HENT:     Head: Normocephalic and atraumatic.  Eyes:     Conjunctiva/sclera: Conjunctivae normal.     Pupils: Pupils are equal, round, and reactive to light.  Cardiovascular:     Rate and Rhythm: Normal rate and regular rhythm.     Heart sounds: Normal heart sounds.  Pulmonary:     Effort: Pulmonary effort is normal.     Breath sounds: Normal breath sounds. No wheezing or rhonchi.  Abdominal:     General: Bowel sounds are normal.     Palpations: Abdomen is soft.  Musculoskeletal:        General: Normal range of motion.     Cervical back: Normal range of motion.  Skin:    General: Skin is warm and dry.  Neurological:     Mental Status: She is alert and oriented to  person, place, and time.    ED Results / Procedures / Treatments   Labs (all labs ordered are listed, but only abnormal results are displayed) Labs Reviewed  CBC WITH DIFFERENTIAL/PLATELET - Abnormal; Notable for the following components:      Result Value   WBC 14.0 (*)    Platelets 473 (*)    Neutro Abs 9.6 (*)    Monocytes Absolute 1.1 (*)    Eosinophils Absolute 0.7 (*)    All other components within normal limits  RESP PANEL BY RT-PCR (FLU A&B, COVID) ARPGX2  COMPREHENSIVE METABOLIC PANEL  URINALYSIS, ROUTINE W REFLEX MICROSCOPIC    EKG None  Radiology DG Chest Portable 1 View  Result Date: 12/12/2021 CLINICAL DATA:  Shortness of breath EXAM: PORTABLE CHEST 1 VIEW COMPARISON:  05/02/2019 FINDINGS: Mild patchy right middle and bilateral lower lobe opacities, right greater than left, suspicious for multifocal pneumonia. No pleural effusion or pneumothorax. Mild cardiomegaly, unchanged. IMPRESSION: Right middle and bilateral lower lobe opacities, right greater than left, suspicious for pneumonia. Electronically Signed   By: Julian Hy M.D.   On: 12/12/2021 22:42    Procedures Procedures    Medications Ordered in ED Medications  levofloxacin (LEVAQUIN) tablet 750 mg (has no administration in time range)    ED Course/ Medical Decision Making/ A&P                           Medical Decision Making Amount and/or Complexity of Data Reviewed External Data Reviewed: labs. Labs: ordered. Radiology: ordered and independent interpretation performed. ECG/medicine tests: ordered and independent interpretation performed.  Risk Prescription drug management.   61 year old female presenting to the ED with shortness of breath and generalized body aches over the past week.  Also reports some dark-colored urine.  She is afebrile and nontoxic, vitals are stable on room air.  She does have dry cough but lung sounds are grossly clear.  She is in no acute respiratory distress.   Labs obtained and are overall reassuring, mild leukocytosis at 14,000.  Normal renal function.  COVID and flu screen are negative.  Chest x-ray with findings of right middle and bilateral lobe pneumonia.  UA also with some signs of infection.  She has been observed here in the ED for 3.5 hours without any noted hypoxia, tachycardia, or hypotension.  Sats on RA maintained at >95% continuously.  Clinically she has no signs of sepsis at this time and appears well overall.  Feel she is stable for discharge home.  Will start on course of Levaquin for coverage of pneumonia and UTI.  She does have  history of C. difficile in the past I have recommended that she take probiotic with this.  She will need to follow-up closely with her primary care doctor.  She is encouraged to return here for any new/acute changes.  Final Clinical Impression(s) / ED Diagnoses Final diagnoses:  Community acquired pneumonia, unspecified laterality  Acute cystitis with hematuria    Rx / DC Orders ED Discharge Orders          Ordered    levofloxacin (LEVAQUIN) 750 MG tablet  Daily        12/13/21 0122    benzonatate (TESSALON) 100 MG capsule  Every 8 hours        12/13/21 0122              Larene Pickett, PA-C 12/13/21 0126    Valarie Merino, MD 12/13/21 2330

## 2021-12-13 LAB — URINALYSIS, ROUTINE W REFLEX MICROSCOPIC
Bilirubin Urine: NEGATIVE
Glucose, UA: NEGATIVE mg/dL
Ketones, ur: 20 mg/dL — AB
Nitrite: NEGATIVE
Protein, ur: 100 mg/dL — AB
Specific Gravity, Urine: 1.017 (ref 1.005–1.030)
WBC, UA: >50 WBC/hpf — ABNORMAL HIGH (ref 0–5)
pH: 5 (ref 5.0–8.0)

## 2021-12-13 MED ORDER — BENZONATATE 100 MG PO CAPS: 100.0000 mg | ORAL_CAPSULE | Freq: Three times a day (TID) | ORAL | 0 refills | Status: AC

## 2021-12-13 MED ORDER — LEVOFLOXACIN 750 MG PO TABS: 750.0000 mg | ORAL_TABLET | Freq: Every day | ORAL | 0 refills | Status: AC

## 2021-12-13 MED ORDER — LEVOFLOXACIN 750 MG PO TABS
750.0000 mg | ORAL_TABLET | Freq: Once | ORAL | Status: AC
  Administered 2021-12-13: 750 mg via ORAL
  Filled 2021-12-13: qty 1

## 2021-12-13 NOTE — Discharge Instructions (Signed)
You were found to have pneumonia today, also have signs of a urine infection. Take the prescribed medication as directed.  Would recommend to take probiotic with antibiotics to reduce risk of c. Diff. Follow-up with your primary care doctor. Return to the ED for new or worsening symptoms.

## 2021-12-13 NOTE — ED Notes (Signed)
Pt NAD, a/ox4. Pt verbalizes understanding of all DC and f/u instructions. All questions answered. Pt walks with steady gait to lobby at DC.  ? ?

## 2022-09-07 ENCOUNTER — Encounter (HOSPITAL_COMMUNITY): Payer: Self-pay
